# Patient Record
Sex: Female | Born: 1950 | Race: Black or African American | Hispanic: No | Marital: Married | Smoking: Never smoker
Health system: Southern US, Community
[De-identification: ages and names within clinical notes are randomized; demographics above are authoritative.]

## PROBLEM LIST (undated history)

## (undated) DIAGNOSIS — E119 Type 2 diabetes mellitus without complications: Secondary | ICD-10-CM

## (undated) DIAGNOSIS — E785 Hyperlipidemia, unspecified: Secondary | ICD-10-CM

## (undated) DIAGNOSIS — I10 Essential (primary) hypertension: Secondary | ICD-10-CM

## (undated) HISTORY — DX: Essential (primary) hypertension: I10

## (undated) HISTORY — DX: Type 2 diabetes mellitus without complications: E11.9

## (undated) HISTORY — DX: Hyperlipidemia, unspecified: E78.5

---

## 2008-05-28 ENCOUNTER — Emergency Department (HOSPITAL_COMMUNITY): Admission: EM | Admit: 2008-05-28 | Discharge: 2008-05-28 | Payer: Self-pay | Admitting: Emergency Medicine
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
## 2018-05-24 LAB — HEMOGLOBIN A1C: Hemoglobin A1C: 10.6

## 2018-07-11 ENCOUNTER — Encounter: Payer: Self-pay | Admitting: "Endocrinology

## 2018-07-11 ENCOUNTER — Ambulatory Visit (INDEPENDENT_AMBULATORY_CARE_PROVIDER_SITE_OTHER): Payer: Medicare Other | Admitting: "Endocrinology

## 2018-07-11 VITALS — BP 132/85 | HR 74 | Ht 66.0 in | Wt 269.0 lb

## 2018-07-11 DIAGNOSIS — E782 Mixed hyperlipidemia: Secondary | ICD-10-CM

## 2018-07-11 DIAGNOSIS — I10 Essential (primary) hypertension: Secondary | ICD-10-CM

## 2018-07-11 DIAGNOSIS — E039 Hypothyroidism, unspecified: Secondary | ICD-10-CM | POA: Diagnosis not present

## 2018-07-11 DIAGNOSIS — E1165 Type 2 diabetes mellitus with hyperglycemia: Secondary | ICD-10-CM

## 2018-07-11 MED ORDER — ACCU-CHEK GUIDE W/DEVICE KIT: 1.0000 | PACK | 0 refills | Status: AC

## 2018-07-11 MED ORDER — GLUCOSE BLOOD VI STRP: ORAL_STRIP | 2 refills | Status: DC

## 2018-07-11 NOTE — Patient Instructions (Signed)

## 2018-07-11 NOTE — Progress Notes (Signed)
Endocrinology Consult Note       07/11/2018, 5:41 PM   Subjective:    Patient ID: Adrian Prince, female    DOB: December 24, 1950.  [place] Barretta is being seen in consultation for management of currently uncontrolled symptomatic diabetes requested by  [name], PA-C.   Past Medical History:  Diagnosis Date  . Diabetes mellitus, type II ([place])   . Hyperlipidemia   . Hypertension    History reviewed. No pertinent surgical history. Social History   Socioeconomic History  . Marital status: Married    Spouse name: Not on file  . Number of children: Not on file  . Years of education: Not on file  . Highest education level: Not on file  Occupational History  . Not on file  Social Needs  . Financial resource strain: Not on file  . Food insecurity:    Worry: Not on file    Inability: Not on file  . Transportation needs:    Medical: Not on file    Non-medical: Not on file  Tobacco Use  . Smoking status: Never Smoker  . Smokeless tobacco: Never Used  Substance and Sexual Activity  . Alcohol use: Not Currently  . Drug use: Never  . Sexual activity: Not on file  Lifestyle  . Physical activity:    Days per week: Not on file    Minutes per session: Not on file  . Stress: Not on file  Relationships  . Social connections:    Talks on phone: Not on file    Gets together: Not on file    Attends religious service: Not on file    Active member of club or organization: Not on file    Attends meetings of clubs or organizations: Not on file    Relationship status: Not on file  Other Topics Concern  . Not on file  Social History Narrative  . Not on file   Outpatient Encounter Medications as of 07/11/2018  Medication Sig  . metFORMIN (GLUCOPHAGE-XR) 500 MG 24 hr tablet Take 500 mg by mouth 2 (two) times daily with a meal.  . metoprolol succinate (TOPROL-XL) 25 MG 24 hr tablet daily.  [name]  omeprazole (PRILOSEC) 40 MG capsule daily.  . Pitavastatin Calcium (LIVALO) 2 MG TABS daily.  . Blood Glucose Monitoring Suppl (ACCU-CHEK GUIDE) w/Device KIT 1 Piece by Does not apply route as directed.  . cetirizine (ZYRTEC) 10 MG tablet daily.  . clotrimazole-betamethasone (LOTRISONE) cream daily.  . Ferrous Sulfate (IRON) 325 (65 Fe) MG TABS daily.  . furosemide (LASIX) 20 MG tablet daily.  [name] glucose blood (ACCU-CHEK GUIDE) test strip Use as instructed  . HYDROcodone-acetaminophen (NORCO/VICODIN) 5-325 MG tablet 2 (two) times daily as needed.  [name] levothyroxine (SYNTHROID, LEVOTHROID) 50 MCG tablet daily.  . valsartan-hydrochlorothiazide (DIOVAN-HCT) 320-25 MG tablet TK 1 T PO QD  . [DISCONTINUED] glipiZIDE (GLUCOTROL XL) 10 MG 24 hr tablet daily.   No facility-administered encounter medications on file as of 07/11/2018.     ALLERGIES: No Known Allergies  VACCINATION STATUS:  There is no immunization history on file for this patient.  Diabetes  She presents  for her initial diabetic visit. She has type 2 diabetes mellitus. Onset time: Was diagnosed at approximate age of 81 years. Her disease course has been worsening. There are no hypoglycemic associated symptoms. Pertinent negatives for hypoglycemia include no confusion, headaches, pallor or seizures. Associated symptoms include fatigue, polydipsia and polyuria. Pertinent negatives for diabetes include no chest pain and no polyphagia. There are no hypoglycemic complications. Symptoms are worsening. Risk factors for coronary artery disease include dyslipidemia, diabetes mellitus, obesity, hypertension, sedentary lifestyle and post-menopausal. Current diabetic treatment includes oral agent (dual therapy). Her weight is fluctuating minimally. She is following a generally unhealthy diet. When asked about meal planning, she reported none. She has not had a previous visit with a dietitian. She rarely participates in exercise. (She did not bring any  meter nor logs to review.  Her most recent A1c of 10.6% from May 24, 2018.) An ACE inhibitor/angiotensin II receptor blocker is being taken. She does not see a podiatrist.Eye exam is current.  Hyperlipidemia  This is a chronic problem. The current episode started more than 1 year ago. Exacerbating diseases include diabetes, hypothyroidism and obesity. Pertinent negatives include no chest pain, myalgias or shortness of breath. Risk factors for coronary artery disease include diabetes mellitus, dyslipidemia, hypertension, a sedentary lifestyle, post-menopausal, obesity and family history.  Hypertension  This is a chronic problem. The current episode started more than 1 year ago. Pertinent negatives include no chest pain, headaches, palpitations or shortness of breath. Risk factors for coronary artery disease include dyslipidemia, diabetes mellitus, obesity, sedentary lifestyle, family history and post-menopausal state. Past treatments include angiotensin blockers and beta blockers.      Review of Systems  Constitutional: Positive for fatigue. Negative for chills, fever and unexpected weight change.  HENT: Negative for trouble swallowing and voice change.   Eyes: Negative for visual disturbance.  Respiratory: Negative for cough, shortness of breath and wheezing.   Cardiovascular: Negative for chest pain, palpitations and leg swelling.  Gastrointestinal: Negative for diarrhea, nausea and vomiting.  Endocrine: Positive for polydipsia and polyuria. Negative for cold intolerance, heat intolerance and polyphagia.  Musculoskeletal: Negative for arthralgias and myalgias.  Skin: Negative for color change, pallor, rash and wound.  Neurological: Negative for seizures and headaches.  Psychiatric/Behavioral: Negative for confusion and suicidal ideas.    Objective:    BP 132/85   Pulse 74   Ht _0  (1.676 m)   Wt 269 lb (122 kg)   BMI 43.42 kg/m   Wt Readings from Last 3 Encounters:  07/11/18 269  lb (122 kg)     Physical Exam  Constitutional: She is oriented to person, place, and time. She appears well-developed.  HENT:  Head: Normocephalic and atraumatic.  Eyes: EOM are normal.  Neck: Normal range of motion. Neck supple. No tracheal deviation present. No thyromegaly present.  Cardiovascular: Normal rate and regular rhythm.  Pulmonary/Chest: Effort normal and breath sounds normal.  Abdominal: Soft. Bowel sounds are normal. There is no tenderness. There is no guarding.  Musculoskeletal: Normal range of motion. She exhibits no edema.  Neurological: She is alert and oriented to person, place, and time. She has normal reflexes. No cranial nerve deficit. Coordination normal.  Skin: Skin is warm and dry. No rash noted. No erythema. No pallor.  Psychiatric: She has a normal mood and affect. Judgment normal.     May 24, 2018 A1c 10.6%.        Assessment & Plan:   1. Uncontrolled type 2 diabetes mellitus with hyperglycemia (  [place])  - [place] has currently uncontrolled symptomatic type 2 DM since 67 years of age,  with most recent A1c of 10.6 %. Recent labs reviewed.  -her diabetes is complicated by obesity/sedentary life and she remains at a high risk for more acute and chronic complications which include CAD, CVA, CKD, retinopathy, and neuropathy. These are all discussed in detail with her.  - I have counseled her on diet management and weight loss, by adopting a carbohydrate restricted/protein rich diet.  - Suggestion is made for her to avoid simple carbohydrates  from her diet including Cakes, Sweet Desserts, Ice Cream, Soda (diet and regular), Sweet Tea, Candies, Chips, Cookies, Store Bought Juices, Alcohol in Excess of  1-2 drinks a day, Artificial Sweeteners,  Coffee Creamer, and "Sugar-free" Products. This will help patient to have more stable blood glucose profile and potentially avoid unintended weight gain.  - I encouraged her to switch to  unprocessed or  minimally processed complex starch and increased protein intake (animal or plant source), fruits, and vegetables.  - she is advised to stick to a routine mealtimes to eat 3 meals  a day and avoid unnecessary snacks ( to snack only to correct hypoglycemia).   - she will be scheduled with Jearld Fenton, RDN, CDE for individualized diabetes education.  - I have approached her with the following individualized plan to manage diabetes and patient agrees:   -Based on her current glycemic burden with A1c of 10.6%, she was approached for basal insulin, however she hesitates to accept this offer.  -She is willing to start monitoring blood glucose 4 times a day--before meals and at bedtime. -She is advised to return in 10 days with her meter and logs.  - she is encouraged to call clinic for blood glucose levels less than 70 or above 300 mg /dl.In the meantime I advised her to continue metformin 500 mg ER p.o. twice daily.  - I will discontinue glipizide, risk outweighs benefit for this patient. - she will be considered for incretin therapy as appropriate next visit. - Patient specific target  A1c;  LDL, HDL, Triglycerides, and  Waist Circumference were discussed in detail.  2) BP/HTN:  her blood pressure is controlled to target.   she is advised to continue her current medications including losartan/HCTZ 320/25 mg p.o. daily with breakfast . 3) Lipids/HPL:   No recent lipid panel to review.   she  is advised to continue    pitavastatin 1 mg daily at bedtime.  Side effects and precautions discussed with her.  4)  Weight/Diet:  Body mass index is 43.42 kg/m.  - clearly complicating her diabetes care.  I discussed with her the fact that loss of 5 - 10% of her  current body weight will have the most impact on her diabetes management.  CDE Consult will be initiated . Exercise, and detailed carbohydrates information provided  -  detailed on discharge instructions.  5) Chronic Care/Health  Maintenance:  -she  is on ACEI/ARB and Statin medications and  is encouraged to initiate and continue to follow up with Ophthalmology, Dentist,  Podiatrist at least yearly or according to recommendations, and advised to  stay away from smoking. I have recommended yearly flu vaccine and pneumonia vaccine at least every 5 years; moderate intensity exercise for up to 150 minutes weekly; and  sleep for at least 7 hours a day.  - I advised patient to maintain close follow up with [name], PA-C for primary care needs.  -  Time spent with the patient: 45 minutes, of which >50% was spent in obtaining information about her symptoms, reviewing her previous labs, evaluations, and treatments, counseling her about her currently uncontrolled type 2 diabetes, hyperlipidemia, hypertension, and developing developing  plans for long term treatment based on the latest recommendations.  [place] participated in the discussions, expressed understanding, and voiced agreement with the above plans.  All questions were answered to her satisfaction. she is encouraged to contact clinic should she have any questions or concerns prior to her return visit.  Follow up plan: - Return in about 10 days (around 07/21/2018) for Follow up with Meter and Logs Only - no Labs.  Glade Lloyd, MD [facility] [address] [place], [place] [address] Phone: [phone]  Fax: [phone]    07/11/2018, 5:41 PM  This note was partially dictated with voice recognition software. Similar sounding words can be transcribed inadequately or may not  be corrected upon review.

## 2018-07-12 ENCOUNTER — Other Ambulatory Visit: Payer: Self-pay

## 2018-07-12 MED ORDER — BLOOD GLUCOSE MONITOR KIT: PACK | 3 refills | Status: AC

## 2018-07-13 ENCOUNTER — Encounter: Payer: Medicare Other | Attending: "Endocrinology | Admitting: Nutrition

## 2018-07-13 ENCOUNTER — Other Ambulatory Visit: Payer: Self-pay

## 2018-07-13 ENCOUNTER — Encounter: Payer: Self-pay | Admitting: Nutrition

## 2018-07-13 VITALS — Ht 68.0 in | Wt 271.0 lb

## 2018-07-13 DIAGNOSIS — E118 Type 2 diabetes mellitus with unspecified complications: Secondary | ICD-10-CM

## 2018-07-13 DIAGNOSIS — E1165 Type 2 diabetes mellitus with hyperglycemia: Secondary | ICD-10-CM | POA: Insufficient documentation

## 2018-07-13 DIAGNOSIS — Z713 Dietary counseling and surveillance: Secondary | ICD-10-CM | POA: Diagnosis not present

## 2018-07-13 DIAGNOSIS — E669 Obesity, unspecified: Secondary | ICD-10-CM

## 2018-07-13 DIAGNOSIS — IMO0002 Reserved for concepts with insufficient information to code with codable children: Secondary | ICD-10-CM

## 2018-07-13 NOTE — Progress Notes (Signed)
  Medical Nutrition Therapy:  Appt start time: 1100 end time:  1200.   Assessment:  Primary concerns today: Diabetes Type 2. Live with her husband and cares for 2 grandkids. Dm for 10+ yrs. Eats 2-3 meals per day.  Feels burned out with her DM. Keeps gaining weight but doesn't eat much. Eats out often and skips lunch usually. Walking some for exercise. Sees Dr. Fransico Him, Endocrinology. Has to get strips for her meter. Suppose to test 4 times per day. Only drinking water. Currently on 500 mg of Metformin BID.Was recently taken off of Glipizide.  Testing blood sugars 4 times per day per Dr. Fransico Him til next visit. Willing to make changes with diet and lifestyle to improve her DM and lose weight.  Lab Results  Component Value Date   HGBA1C 10.6 05/24/2018   Vitals with BMI 07/13/2018  Height 5\' 8"   Weight 271 lbs  BMI 41.22  Systolic   Diastolic   Pulse      Preferred Learning Style:    No preference indicated   Learning Readiness:    Ready  Change in progress   MEDICATIONS:    DIETARY INTAKE:   24-hr recall:  B ( AM): Water and apple slices Snk ( AM): water  L ( PM): Rotisserie chicken, water Snk ( PM):  D ( PM): Broccoli, cauliflower and ranch dressing.  Snk ( PM):  Beverages: water  Usual physical activity: ADL  Estimated energy needs: 1500  calories 170 g carbohydrates 112 g protein 42 g fat  Progress Towards Goal(s):  In progress.   Nutritional Diagnosis:  NB-1.1 Food and nutrition-related knowledge deficit As related to Diabetes.  As evidenced by A1C 10.6%.    Intervention:  Nutrition and Diabetes education provided on My Plate, CHO counting, meal planning, portion sizes, timing of meals, avoiding snacks between meals unless having a low blood sugar, target ranges for A1C and blood sugars, signs/symptoms and treatment of hyper/hypoglycemia, monitoring blood sugars, taking medications as prescribed, benefits of exercising 30 minutes per day and prevention of  complications of DM. Marland KitchenGoals 1. Follow MY Plate 2. Eat three meals per day 3. Cut out snacks 4, Drink only water 5. Test BS 4 times per day per Dr. Fransico Him  Teaching Method Utilized:  Visual Auditory Hands on  Handouts given during visit include:  The Plate Method  Meal Plan Card   Barriers to learning/adherence to lifestyle change: none   Demonstrated degree of understanding via:  Teach Back   Monitoring/Evaluation:  Dietary intake, exercise, , and body weight in 1 month(s).

## 2018-07-13 NOTE — Patient Instructions (Signed)
Goals 1. Follow MY Plate 2. Eat three meals per day 3. Cut out snacks 4, Drink only water 5. Test BS 4 times per day per Dr. Fransico Him

## 2018-07-25 ENCOUNTER — Encounter: Payer: Self-pay | Admitting: "Endocrinology

## 2018-07-25 ENCOUNTER — Ambulatory Visit (INDEPENDENT_AMBULATORY_CARE_PROVIDER_SITE_OTHER): Payer: Medicare Other | Admitting: "Endocrinology

## 2018-07-25 VITALS — BP 159/92 | HR 67 | Ht 66.0 in | Wt 266.0 lb

## 2018-07-25 DIAGNOSIS — E1165 Type 2 diabetes mellitus with hyperglycemia: Secondary | ICD-10-CM | POA: Diagnosis not present

## 2018-07-25 DIAGNOSIS — E039 Hypothyroidism, unspecified: Secondary | ICD-10-CM | POA: Diagnosis not present

## 2018-07-25 DIAGNOSIS — I10 Essential (primary) hypertension: Secondary | ICD-10-CM

## 2018-07-25 DIAGNOSIS — E782 Mixed hyperlipidemia: Secondary | ICD-10-CM | POA: Diagnosis not present

## 2018-07-25 LAB — GLUCOSE, POCT (MANUAL RESULT ENTRY): POC Glucose: 271 mg/dl — AB (ref 70–99)

## 2018-07-25 MED ORDER — INSULIN GLARGINE 100 UNIT/ML SOLOSTAR PEN: 30.0000 [IU] | PEN_INJECTOR | Freq: Every day | SUBCUTANEOUS | 2 refills | Status: DC

## 2018-07-25 MED ORDER — INSULIN PEN NEEDLE 31G X 8 MM MISC
1.0000 | 3 refills | Status: DC
Start: 2018-07-25 — End: 2018-07-26

## 2018-07-25 NOTE — Patient Instructions (Signed)

## 2018-07-25 NOTE — Progress Notes (Signed)
Endocrinology follow up Note       07/25/2018, 9:31 AM   Subjective:    Patient ID: Natasha Hinton, female    DOB: [birth year].  Natasha Hinton is being seen in follow up for management of currently uncontrolled symptomatic type 2 diabetes, hypothyroidism, hyperlipidemia, hypertension. PCP:   [name], PA-C.   Past Medical History:  Diagnosis Date  . Diabetes mellitus, type II ([place])   . Hyperlipidemia   . Hypertension    History reviewed. No pertinent surgical history. Social History   Socioeconomic History  . Marital status: Married    Spouse name: Not on file  . Number of children: Not on file  . Years of education: Not on file  . Highest education level: Not on file  Occupational History  . Not on file  Social Needs  . Financial resource strain: Not on file  . Food insecurity:    Worry: Not on file    Inability: Not on file  . Transportation needs:    Medical: Not on file    Non-medical: Not on file  Tobacco Use  . Smoking status: Never Smoker  . Smokeless tobacco: Never Used  Substance and Sexual Activity  . Alcohol use: Not Currently  . Drug use: Never  . Sexual activity: Not on file  Lifestyle  . Physical activity:    Days per week: Not on file    Minutes per session: Not on file  . Stress: Not on file  Relationships  . Social connections:    Talks on phone: Not on file    Gets together: Not on file    Attends religious service: Not on file    Active member of club or organization: Not on file    Attends meetings of clubs or organizations: Not on file    Relationship status: Not on file  Other Topics Concern  . Not on file  Social History Narrative  . Not on file   Outpatient Encounter Medications as of 07/25/2018  Medication Sig  . blood glucose meter kit and supplies KIT Dispense based on patient and insurance preference. Use up to four times daily as  directed. (FOR ICD-10 E11.65)  . Blood Glucose Monitoring Suppl (ACCU-CHEK GUIDE) w/Device KIT 1 Piece by Does not apply route as directed.  . cetirizine (ZYRTEC) 10 MG tablet daily.  . clotrimazole-betamethasone (LOTRISONE) cream daily.  . Ferrous Sulfate (IRON) 325 (65 Fe) MG TABS daily.  . furosemide (LASIX) 20 MG tablet daily.  [name] glucose blood (ACCU-CHEK GUIDE) test strip Use as instructed  . HYDROcodone-acetaminophen (NORCO/VICODIN) 5-325 MG tablet 2 (two) times daily as needed.  . Insulin Glargine (LANTUS SOLOSTAR) 100 UNIT/ML Solostar Pen Inject 30 Units into the skin daily.  . Insulin Pen Needle (B-D ULTRAFINE III SHORT PEN) 31G X 8 MM MISC 1 each by Does not apply route as directed.  [name] levothyroxine (SYNTHROID, LEVOTHROID) 50 MCG tablet daily.  . metFORMIN (GLUCOPHAGE-XR) 500 MG 24 hr tablet Take 500 mg by mouth 2 (two) times daily with a meal.  . metoprolol succinate (TOPROL-XL) 25 MG 24 hr tablet daily.  [name] omeprazole (PRILOSEC)  40 MG capsule daily.  . Pitavastatin Calcium (LIVALO) 2 MG TABS daily.  . valsartan-hydrochlorothiazide (DIOVAN-HCT) 320-25 MG tablet TK 1 T PO QD   No facility-administered encounter medications on file as of 07/25/2018.     ALLERGIES: No Known Allergies  VACCINATION STATUS:  There is no immunization history on file for this patient.  Diabetes  She presents for her follow-up diabetic visit. She has type 2 diabetes mellitus. Onset time: Was diagnosed at approximate age of 58 years. Her disease course has been worsening. There are no hypoglycemic associated symptoms. Pertinent negatives for hypoglycemia include no confusion, headaches, pallor or seizures. Associated symptoms include fatigue, polydipsia and polyuria. Pertinent negatives for diabetes include no chest pain and no polyphagia. There are no hypoglycemic complications. Symptoms are worsening. Risk factors for coronary artery disease include dyslipidemia, diabetes mellitus, obesity, hypertension,  sedentary lifestyle and post-menopausal. Current diabetic treatment includes oral agent (dual therapy). Her weight is fluctuating minimally. She is following a generally unhealthy diet. When asked about meal planning, she reported none. She has not had a previous visit with a dietitian. She rarely participates in exercise. Her breakfast blood glucose range is generally >200 mg/dl. Her lunch blood glucose range is generally >200 mg/dl. Her dinner blood glucose range is generally >200 mg/dl. Her bedtime blood glucose range is generally >200 mg/dl. Her overall blood glucose range is >200 mg/dl. (She presents with persistently above target glycemic profile averaging 250-300 mg/dL.  Her most recent A1c of 10.6% from May 24, 2018.) An ACE inhibitor/angiotensin II receptor blocker is being taken. She does not see a podiatrist.Eye exam is current.  Hyperlipidemia  This is a chronic problem. The current episode started more than 1 year ago. Exacerbating diseases include diabetes, hypothyroidism and obesity. Pertinent negatives include no chest pain, myalgias or shortness of breath. Risk factors for coronary artery disease include diabetes mellitus, dyslipidemia, hypertension, a sedentary lifestyle, post-menopausal, obesity and family history.  Hypertension  This is a chronic problem. The current episode started more than 1 year ago. Pertinent negatives include no chest pain, headaches, palpitations or shortness of breath. Risk factors for coronary artery disease include dyslipidemia, diabetes mellitus, obesity, sedentary lifestyle, family history and post-menopausal state. Past treatments include angiotensin blockers and beta blockers.    Review of Systems  Constitutional: Positive for fatigue. Negative for chills, fever and unexpected weight change.  HENT: Negative for trouble swallowing and voice change.   Eyes: Negative for visual disturbance.  Respiratory: Negative for cough, shortness of breath and  wheezing.   Cardiovascular: Negative for chest pain, palpitations and leg swelling.  Gastrointestinal: Negative for diarrhea, nausea and vomiting.  Endocrine: Positive for polydipsia and polyuria. Negative for cold intolerance, heat intolerance and polyphagia.  Musculoskeletal: Negative for arthralgias and myalgias.  Skin: Negative for color change, pallor, rash and wound.  Neurological: Negative for seizures and headaches.  Psychiatric/Behavioral: Negative for confusion and suicidal ideas.    Objective:    BP (!) 159/92   Pulse 67   Ht '5\' 6"'  (1.676 m)   Wt 266 lb (120.7 kg)   BMI 42.93 kg/m   Wt Readings from Last 3 Encounters:  07/25/18 266 lb (120.7 kg)  07/13/18 271 lb (122.9 kg)  07/11/18 269 lb (122 kg)     Physical Exam  Constitutional: She is oriented to person, place, and time. She appears well-developed.  HENT:  Head: Normocephalic and atraumatic.  Eyes: EOM are normal.  Neck: Normal range of motion. Neck supple. No tracheal deviation present.  No thyromegaly present.  Cardiovascular: Normal rate and regular rhythm.  Pulmonary/Chest: Effort normal and breath sounds normal.  Abdominal: Soft. Bowel sounds are normal. There is no tenderness. There is no guarding.  Musculoskeletal: Normal range of motion. She exhibits no edema.  Neurological: She is alert and oriented to person, place, and time. She has normal reflexes. No cranial nerve deficit. Coordination normal.  Skin: Skin is warm and dry. No rash noted. No erythema. No pallor.  Psychiatric: She has a normal mood and affect. Judgment normal.      Recent Results (from the past 2160 hour(s))  Hemoglobin A1c     Status: None   Collection Time: 05/24/18 12:00 AM  Result Value Ref Range   Hemoglobin A1C 10.6      Assessment & Plan:   1. Uncontrolled type 2 diabetes mellitus with hyperglycemia ([place])  - Natasha Hinton has currently uncontrolled symptomatic type 2 DM since 67 years of age. -She presents with  persistent, severe hyperglycemia averaging 250-300 mg/dL over the last 2 weeks.  Her most recent A1c was 10.6%.  -her diabetes is complicated by obesity/sedentary life and she remains at a high risk for more acute and chronic complications which include CAD, CVA, CKD, retinopathy, and neuropathy. These are all discussed in detail with her.  - I have counseled her on diet management and weight loss, by adopting a carbohydrate restricted/protein rich diet.  -She still admits to dietary indiscretions, including consumption of sweets and sweetened beverages. -  Suggestion is made for her to avoid simple carbohydrates  from her diet including Cakes, Sweet Desserts / Pastries, Ice Cream, Soda (diet and regular), Sweet Tea, Candies, Chips, Cookies, Store Bought Juices, Alcohol in Excess of  1-2 drinks a day, Artificial Sweeteners, and "Sugar-free" Products. This will help patient to have stable blood glucose profile and potentially avoid unintended weight gain.   - I encouraged her to switch to  unprocessed or minimally processed complex starch and increased protein intake (animal or plant source), fruits, and vegetables.  - she is advised to stick to a routine mealtimes to eat 3 meals  a day and avoid unnecessary snacks ( to snack only to correct hypoglycemia).   - I have approached her with the following individualized plan to manage diabetes and patient agrees:   -Based on her current glycemic burden with A1c of 10.6%, she is approached for basal insulin for the second time and this time she is accepting.   -I discussed and initiated Lantus 30 units nightly, associated with monitoring of blood glucose at least 2 times a day-daily before breakfast and at bedtime. With a sample of Lantus, insulin injection technique is demonstrated for her in the exam room. -She may require prandial insulin based on her commitment and presentation during her next visit in 5 weeks. -She is advised to continue metformin  500 mg p.o. twice daily.  - she is encouraged to call clinic for blood glucose levels less than 70 or above 300 mg /dl.  - she will be considered for incretin therapy as appropriate next visit. - Patient specific target  A1c;  LDL, HDL, Triglycerides, and  Waist Circumference were discussed in detail.  2) BP/HTN:  her blood pressure is controlled to target.   she is advised to continue her current medications including losartan/HCTZ 320/25 mg p.o. daily with breakfast . 3) Lipids/HPL:   No recent lipid panel to review.   she  is advised to continue    pitavastatin 1 mg  daily at bedtime.  She will have fasting lipid panel before her next visit.  Side effects and precautions discussed with her.  4)  Weight/Diet:  Body mass index is 42.93 kg/m.  - clearly complicating her diabetes care.  I discussed with her the fact that loss of 5 - 10% of her  current body weight will have the most impact on her diabetes management.  CDE Consult will be initiated . Exercise, and detailed carbohydrates information provided  -  detailed on discharge instructions.  5) hypothyroidism: -The circumstance of her diagnosis are not available to review. -She is currently on levothyroxine 50 mcg p.o. every morning.  - We discussed about correct intake of levothyroxine, at fasting, with water, separated by at least 30 minutes from breakfast, and separated by more than 4 hours from calcium, iron, multivitamins, acid reflux medications (PPIs). -Patient is made aware of the fact that thyroid hormone replacement is needed for life, dose to be adjusted by periodic monitoring of thyroid function tests.  6) Chronic Care/Health Maintenance:  -she  is on ACEI/ARB and Statin medications and  is encouraged to initiate and continue to follow up with Ophthalmology, Dentist,  Podiatrist at least yearly or according to recommendations, and advised to  stay away from smoking. I have recommended yearly flu vaccine and pneumonia vaccine at  least every 5 years; moderate intensity exercise for up to 150 minutes weekly; and  sleep for at least 7 hours a day.  - I advised patient to maintain close follow up with [name], PA-C for primary care needs.  - Time spent with the patient: 25 min, of which >50% was spent in reviewing her blood glucose logs , discussing her hypo- and hyper-glycemic episodes, reviewing her current and  previous labs and insulin doses and developing a plan to avoid hypo- and hyper-glycemia. Please refer to Patient Instructions for Blood Glucose Monitoring and Insulin/Medications Dosing Guide"  in media tab for additional information. Natasha Hinton participated in the discussions, expressed understanding, and voiced agreement with the above plans.  All questions were answered to her satisfaction. she is encouraged to contact clinic should she have any questions or concerns prior to her return visit.   Follow up plan: - Return in about 5 weeks (around 08/29/2018) for Follow up with Pre-visit Labs, Meter, and Logs.  Glade Lloyd, MD [facility] [address] [place], [place] [address] Phone: [phone]  Fax: [phone]    07/25/2018, 9:31 AM  This note was partially dictated with voice recognition software. Similar sounding words can be transcribed inadequately or may not  be corrected upon review.

## 2018-07-26 ENCOUNTER — Other Ambulatory Visit: Payer: Self-pay

## 2018-07-26 MED ORDER — INSULIN PEN NEEDLE 31G X 8 MM MISC: 1.0000 | Freq: Every day | 3 refills | Status: DC

## 2018-08-15 ENCOUNTER — Telehealth: Payer: Self-pay

## 2018-08-15 ENCOUNTER — Other Ambulatory Visit: Payer: Self-pay

## 2018-08-15 MED ORDER — GLUCOSE BLOOD VI STRP: ORAL_STRIP | 5 refills | Status: DC

## 2018-08-15 NOTE — Telephone Encounter (Signed)
Pt states she has had high BG readings. She states she started a medrol dose pak 08/13/18.    Date Before breakfast Before lunch Before supper Bedtime  11/9    390  11/10 246   414  11/11 361             Pt taking: Lantus 30 units, Metformin XR 500mg  bid.   She will also call her PCP for any recommendations and make [place] aware of any changes.

## 2018-08-22 ENCOUNTER — Encounter: Payer: Self-pay | Admitting: "Endocrinology

## 2018-08-22 LAB — LIPID PANEL
Cholesterol: 278 — AB (ref 0–200)
HDL: 87 — AB (ref 35–70)
LDL CALC: 171
Triglycerides: 98 (ref 40–160)

## 2018-08-22 LAB — BASIC METABOLIC PANEL
BUN: 25 — AB (ref 4–21)
Creatinine: 1 (ref 0.5–1.1)

## 2018-08-22 LAB — MICROALBUMIN, URINE: MICROALB UR: 0.2

## 2018-08-22 LAB — TSH: TSH: 1.37 (ref 0.41–5.90)

## 2018-08-22 LAB — HEMOGLOBIN A1C: HEMOGLOBIN A1C: 10.5

## 2018-08-22 NOTE — Telephone Encounter (Signed)
Increase Lantus to 50 units nightly, start monitoring blood glucose 4 times a day-before meals and at bedtime, continue metformin XR 500 mg p.o. twice daily.  Advise her to keep her appointments.

## 2018-08-23 NOTE — Telephone Encounter (Signed)
Called pt. No answer. VM full 

## 2018-08-24 NOTE — Telephone Encounter (Signed)
Called pt. No answer. VM full. Awaiting return call

## 2018-08-29 ENCOUNTER — Ambulatory Visit (INDEPENDENT_AMBULATORY_CARE_PROVIDER_SITE_OTHER): Payer: Medicare Other | Admitting: "Endocrinology

## 2018-08-29 ENCOUNTER — Encounter: Payer: Self-pay | Admitting: "Endocrinology

## 2018-08-29 VITALS — BP 135/73 | HR 102 | Ht 66.0 in | Wt 258.0 lb

## 2018-08-29 DIAGNOSIS — E039 Hypothyroidism, unspecified: Secondary | ICD-10-CM

## 2018-08-29 DIAGNOSIS — E782 Mixed hyperlipidemia: Secondary | ICD-10-CM

## 2018-08-29 DIAGNOSIS — I10 Essential (primary) hypertension: Secondary | ICD-10-CM

## 2018-08-29 DIAGNOSIS — E1165 Type 2 diabetes mellitus with hyperglycemia: Secondary | ICD-10-CM | POA: Diagnosis not present

## 2018-08-29 MED ORDER — INSULIN GLARGINE 100 UNIT/ML SOLOSTAR PEN: 50.0000 [IU] | PEN_INJECTOR | Freq: Every day | SUBCUTANEOUS | 2 refills | Status: DC

## 2018-08-29 MED ORDER — LEVOTHYROXINE SODIUM 75 MCG PO TABS: 75.0000 ug | ORAL_TABLET | Freq: Every day | ORAL | 3 refills | Status: DC

## 2018-08-29 NOTE — Progress Notes (Signed)
Endocrinology follow up Note       08/29/2018, 12:56 PM   Subjective:    Patient ID: Natasha Hinton, female    DOB: 11/22/1950.  Natasha Hinton is being seen in follow up for management of currently uncontrolled symptomatic type 2 diabetes, hypothyroidism, hyperlipidemia, hypertension. PCP:   [name], PA-C.   Past Medical History:  Diagnosis Date  . Diabetes mellitus, type II ([place])   . Hyperlipidemia   . Hypertension    History reviewed. No pertinent surgical history. Social History   Socioeconomic History  . Marital status: Married    Spouse name: Not on file  . Number of children: Not on file  . Years of education: Not on file  . Highest education level: Not on file  Occupational History  . Not on file  Social Needs  . Financial resource strain: Not on file  . Food insecurity:    Worry: Not on file    Inability: Not on file  . Transportation needs:    Medical: Not on file    Non-medical: Not on file  Tobacco Use  . Smoking status: Never Smoker  . Smokeless tobacco: Never Used  Substance and Sexual Activity  . Alcohol use: Not Currently  . Drug use: Never  . Sexual activity: Not on file  Lifestyle  . Physical activity:    Days per week: Not on file    Minutes per session: Not on file  . Stress: Not on file  Relationships  . Social connections:    Talks on phone: Not on file    Gets together: Not on file    Attends religious service: Not on file    Active member of club or organization: Not on file    Attends meetings of clubs or organizations: Not on file    Relationship status: Not on file  Other Topics Concern  . Not on file  Social History Narrative  . Not on file   Outpatient Encounter Medications as of 08/29/2018  Medication Sig  . blood glucose meter kit and supplies KIT Dispense based on patient and insurance preference. Use up to four times daily as  directed. (FOR ICD-10 E11.65)  . Blood Glucose Monitoring Suppl (ACCU-CHEK GUIDE) w/Device KIT 1 Piece by Does not apply route as directed.  . cetirizine (ZYRTEC) 10 MG tablet daily.  . clotrimazole-betamethasone (LOTRISONE) cream daily.  . Ferrous Sulfate (IRON) 325 (65 Fe) MG TABS daily.  . furosemide (LASIX) 20 MG tablet daily.  [name] glucose blood (ACCU-CHEK GUIDE) test strip Use as instructed bid. E11.65  . HYDROcodone-acetaminophen (NORCO/VICODIN) 5-325 MG tablet 2 (two) times daily as needed.  . Insulin Glargine (LANTUS SOLOSTAR) 100 UNIT/ML Solostar Pen Inject 50 Units into the skin at bedtime.  . Insulin Pen Needle (B-D ULTRAFINE III SHORT PEN) 31G X 8 MM MISC 1 each by Does not apply route at bedtime.  [name] levothyroxine (SYNTHROID, LEVOTHROID) 75 MCG tablet Take 1 tablet (75 mcg total) by mouth daily before breakfast.  . metFORMIN (GLUCOPHAGE-XR) 500 MG 24 hr tablet Take 500 mg by mouth 2 (two) times daily with a meal.  .  metoprolol succinate (TOPROL-XL) 25 MG 24 hr tablet daily.  [name] omeprazole (PRILOSEC) 40 MG capsule daily.  . Pitavastatin Calcium (LIVALO) 2 MG TABS daily.  . valsartan-hydrochlorothiazide (DIOVAN-HCT) 320-25 MG tablet TK 1 T PO QD  . [DISCONTINUED] Insulin Glargine (LANTUS SOLOSTAR) 100 UNIT/ML Solostar Pen Inject 30 Units into the skin daily.  . [DISCONTINUED] levothyroxine (SYNTHROID, LEVOTHROID) 50 MCG tablet daily.   No facility-administered encounter medications on file as of 08/29/2018.     ALLERGIES: No Known Allergies  VACCINATION STATUS:  There is no immunization history on file for this patient.  Diabetes  She presents for her follow-up diabetic visit. She has type 2 diabetes mellitus. Onset time: Was diagnosed at approximate age of 51 years. Her disease course has been worsening. There are no hypoglycemic associated symptoms. Pertinent negatives for hypoglycemia include no confusion, headaches, pallor or seizures. Associated symptoms include fatigue,  polydipsia and polyuria. Pertinent negatives for diabetes include no chest pain and no polyphagia. There are no hypoglycemic complications. Symptoms are worsening. Risk factors for coronary artery disease include dyslipidemia, diabetes mellitus, obesity, hypertension, sedentary lifestyle and post-menopausal. Current diabetic treatment includes oral agent (dual therapy). Her weight is fluctuating minimally. She is following a generally unhealthy diet. When asked about meal planning, she reported none. She has not had a previous visit with a dietitian. She rarely participates in exercise. Her breakfast blood glucose range is generally 180-200 mg/dl. Her bedtime blood glucose range is generally >200 mg/dl. Her overall blood glucose range is >200 mg/dl. (Patient reports interim exposure to steroids related to upper respirator tract infection which caused her to have above target glycemic profile and her Lantus was increased to 40 units nightly by her PCP.) An ACE inhibitor/angiotensin II receptor blocker is being taken. She does not see a podiatrist.Eye exam is current.  Hyperlipidemia  This is a chronic problem. The current episode started more than 1 year ago. Exacerbating diseases include diabetes, hypothyroidism and obesity. Pertinent negatives include no chest pain, myalgias or shortness of breath. Risk factors for coronary artery disease include diabetes mellitus, dyslipidemia, hypertension, a sedentary lifestyle, post-menopausal, obesity and family history.  Hypertension  This is a chronic problem. The current episode started more than 1 year ago. Pertinent negatives include no chest pain, headaches, palpitations or shortness of breath. Risk factors for coronary artery disease include dyslipidemia, diabetes mellitus, obesity, sedentary lifestyle, family history and post-menopausal state. Past treatments include angiotensin blockers and beta blockers.    Review of Systems  Constitutional: Positive for  fatigue. Negative for chills, fever and unexpected weight change.  HENT: Negative for trouble swallowing and voice change.   Eyes: Negative for visual disturbance.  Respiratory: Negative for cough, shortness of breath and wheezing.   Cardiovascular: Negative for chest pain, palpitations and leg swelling.  Gastrointestinal: Negative for diarrhea, nausea and vomiting.  Endocrine: Positive for polydipsia and polyuria. Negative for cold intolerance, heat intolerance and polyphagia.  Musculoskeletal: Negative for arthralgias and myalgias.  Skin: Negative for color change, pallor, rash and wound.  Neurological: Negative for seizures and headaches.  Psychiatric/Behavioral: Negative for confusion and suicidal ideas.    Objective:    BP 135/73   Pulse (!) 102   Ht '5\' 6"'  (1.676 m)   Wt 258 lb (117 kg)   BMI 41.64 kg/m   Wt Readings from Last 3 Encounters:  08/29/18 258 lb (117 kg)  07/25/18 266 lb (120.7 kg)  07/13/18 271 lb (122.9 kg)     Physical Exam  Constitutional: She  is oriented to person, place, and time. She appears well-developed.  HENT:  Head: Normocephalic and atraumatic.  Eyes: EOM are normal.  Neck: Normal range of motion. Neck supple. No tracheal deviation present. No thyromegaly present.  Cardiovascular: Normal rate and regular rhythm.  Pulmonary/Chest: Effort normal and breath sounds normal.  Abdominal: Soft. Bowel sounds are normal. There is no tenderness. There is no guarding.  Musculoskeletal: Normal range of motion. She exhibits no edema.  Neurological: She is alert and oriented to person, place, and time. She has normal reflexes. No cranial nerve deficit. Coordination normal.  Skin: Skin is warm and dry. No rash noted. No erythema. No pallor.  Psychiatric: She has a normal mood and affect. Judgment normal.      Recent Results (from the past 2160 hour(s))  POCT glucose (manual entry)     Status: Abnormal   Collection Time: 07/25/18 10:45 AM  Result Value Ref  Range   POC Glucose 271 (A) 70 - 99 mg/dl    Comment: 1 hr PC  Microalbumin, urine     Status: None   Collection Time: 08/22/18 12:00 AM  Result Value Ref Range   Microalb, Ur 0.2   Basic metabolic panel     Status: Abnormal   Collection Time: 08/22/18 12:00 AM  Result Value Ref Range   BUN 25 (A) 4 - 21   Creatinine 1.0 0.5 - 1.1  Lipid panel     Status: Abnormal   Collection Time: 08/22/18 12:00 AM  Result Value Ref Range   Triglycerides 98 40 - 160   Cholesterol 278 (A) 0 - 200   HDL 87 (A) 35 - 70   LDL Cholesterol 171   Hemoglobin A1c     Status: None   Collection Time: 08/22/18 12:00 AM  Result Value Ref Range   Hemoglobin A1C 10.5   TSH     Status: None   Collection Time: 08/22/18 12:00 AM  Result Value Ref Range   TSH 1.37 0.41 - 5.90    Comment: free t4 0.84     Assessment & Plan:   1. Uncontrolled type 2 diabetes mellitus with hyperglycemia ([place])  - Natasha Hinton has currently uncontrolled symptomatic type 2 DM since 67 years of age. -She presents with slightly improved fasting blood glucose profile in the interim anterior she was given steroids for upper respiratory infection.  Her previsit labs show A1c of 10.5% unchanged from her last A1c of 10.6%.   -her diabetes is complicated by obesity/sedentary life and she remains at a high risk for more acute and chronic complications which include CAD, CVA, CKD, retinopathy, and neuropathy. These are all discussed in detail with her.  - I have counseled her on diet management and weight loss, by adopting a carbohydrate restricted/protein rich diet.  -She still admits to dietary indiscretions, including consumption of sweets and sweetened beverages.  -  Suggestion is made for her to avoid simple carbohydrates  from her diet including Cakes, Sweet Desserts / Pastries, Ice Cream, Soda (diet and regular), Sweet Tea, Candies, Chips, Cookies, Store Bought Juices, Alcohol in Excess of  1-2 drinks a day, Artificial  Sweeteners, and "Sugar-free" Products. This will help patient to have stable blood glucose profile and potentially avoid unintended weight gain.  - I encouraged her to switch to  unprocessed or minimally processed complex starch and increased protein intake (animal or plant source), fruits, and vegetables.  - she is advised to stick to a routine mealtimes to eat 3  meals  a day and avoid unnecessary snacks ( to snack only to correct hypoglycemia).   - I have approached her with the following individualized plan to manage diabetes and patient agrees:   -Based on her current glycemic burden with A1c of 10.5%, she will continue to require higher dose of insulin in order for her to achieve control of diabetes to target.   -She is more comfortable taking insulin this time, advised her to increase her Lantus to 50 units nightly associated with monitoring of blood glucose at least 2 times a day-daily before breakfast and at bedtime.  -She may require prandial insulin based on her commitment and presentation during her next visit.  -She is advised to continue metformin 500 mg p.o. twice daily.  - she is encouraged to call clinic for blood glucose levels less than 70 or above 300 mg /dl.  - she will be considered for incretin therapy as appropriate next visit. - Patient specific target  A1c;  LDL, HDL, Triglycerides, and  Waist Circumference were discussed in detail.  2) BP/HTN:  her blood pressure is controlled to target.   she is advised to continue her current medications including losartan/HCTZ 320/25 mg p.o. daily with breakfast . 3) Lipids/HPL: Her recent lipid panel showed LDL at 171.  She admits that she has not been consistent taking her Livalo.  She is advised to resume and continue Livalo 2 mg p.o. nightly.    4)  Weight/Diet:  Body mass index is 41.64 kg/m.  - clearly complicating her diabetes care.  I discussed with her the fact that loss of 5 - 10% of her  current body weight will have  the most impact on her diabetes management.  CDE Consult will be initiated . Exercise, and detailed carbohydrates information provided  -  detailed on discharge instructions.  5) hypothyroidism: -The circumstance of her diagnosis are not available to review. -She would benefit from a higher dose of levothyroxine.  I discussed and increase her levothyroxine to 75 mcg p.o. every morning.     - We discussed about correct intake of levothyroxine, at fasting, with water, separated by at least 30 minutes from breakfast, and separated by more than 4 hours from calcium, iron, multivitamins, acid reflux medications (PPIs). -Patient is made aware of the fact that thyroid hormone replacement is needed for life, dose to be adjusted by periodic monitoring of thyroid function tests.   6) Chronic Care/Health Maintenance:  -she  is on ACEI/ARB and Statin medications and  is encouraged to initiate and continue to follow up with Ophthalmology, Dentist,  Podiatrist at least yearly or according to recommendations, and advised to  stay away from smoking. I have recommended yearly flu vaccine and pneumonia vaccine at least every 5 years; moderate intensity exercise for up to 150 minutes weekly; and  sleep for at least 7 hours a day.  - I advised patient to maintain close follow up with [name], PA-C for primary care needs.  - Time spent with the patient: 25 min, of which >50% was spent in reviewing her blood glucose logs , discussing her hypo- and hyper-glycemic episodes, reviewing her current and  previous labs and insulin doses and developing a plan to avoid hypo- and hyper-glycemia. Please refer to Patient Instructions for Blood Glucose Monitoring and Insulin/Medications Dosing Guide"  in media tab for additional information. Natasha Hinton participated in the discussions, expressed understanding, and voiced agreement with the above plans.  All questions were answered to  her satisfaction. she is encouraged  to contact clinic should she have any questions or concerns prior to her return visit.  Follow up plan: - Return in about 3 months (around 11/29/2018) for Follow up with Pre-visit Labs, Meter, and Logs.  Glade Lloyd, MD [facility] [address] [place],  [address] Phone: [phone]  Fax: [phone]    08/29/2018, 12:56 PM  This note was partially dictated with voice recognition software. Similar sounding words can be transcribed inadequately or may not  be corrected upon review.

## 2018-08-29 NOTE — Patient Instructions (Signed)

## 2018-11-23 LAB — TSH: TSH: 3.84 (ref 0.41–5.90)

## 2018-11-23 LAB — BASIC METABOLIC PANEL
BUN: 11 (ref 4–21)
Creatinine: 0.9 (ref 0.5–1.1)

## 2018-11-23 LAB — HEMOGLOBIN A1C: Hemoglobin A1C: 8.8

## 2018-11-30 ENCOUNTER — Ambulatory Visit (INDEPENDENT_AMBULATORY_CARE_PROVIDER_SITE_OTHER): Payer: Medicare Other | Admitting: "Endocrinology

## 2018-11-30 ENCOUNTER — Encounter: Payer: Self-pay | Admitting: "Endocrinology

## 2018-11-30 VITALS — BP 155/90 | HR 72 | Ht 66.0 in | Wt 280.0 lb

## 2018-11-30 DIAGNOSIS — E782 Mixed hyperlipidemia: Secondary | ICD-10-CM | POA: Diagnosis not present

## 2018-11-30 DIAGNOSIS — E1165 Type 2 diabetes mellitus with hyperglycemia: Secondary | ICD-10-CM | POA: Diagnosis not present

## 2018-11-30 DIAGNOSIS — E039 Hypothyroidism, unspecified: Secondary | ICD-10-CM | POA: Diagnosis not present

## 2018-11-30 DIAGNOSIS — I10 Essential (primary) hypertension: Secondary | ICD-10-CM | POA: Diagnosis not present

## 2018-11-30 MED ORDER — LEVOTHYROXINE SODIUM 88 MCG PO TABS: 88.0000 ug | ORAL_TABLET | Freq: Every day | ORAL | 3 refills | Status: DC

## 2018-11-30 NOTE — Patient Instructions (Signed)

## 2018-11-30 NOTE — Progress Notes (Signed)
Endocrinology follow up Note       11/30/2018, 11:03 AM   Subjective:    Patient ID: Natasha Hinton, female    DOB: [birth year].  Natasha Hinton is being seen in follow up for management of currently uncontrolled symptomatic type 2 diabetes, hypothyroidism, hyperlipidemia, hypertension. PCP:   [name], PA-C.   Past Medical History:  Diagnosis Date  . Diabetes mellitus, type II ([place])   . Hyperlipidemia   . Hypertension    History reviewed. No pertinent surgical history. Social History   Socioeconomic History  . Marital status: Married    Spouse name: Not on file  . Number of children: Not on file  . Years of education: Not on file  . Highest education level: Not on file  Occupational History  . Not on file  Social Needs  . Financial resource strain: Not on file  . Food insecurity:    Worry: Not on file    Inability: Not on file  . Transportation needs:    Medical: Not on file    Non-medical: Not on file  Tobacco Use  . Smoking status: Never Smoker  . Smokeless tobacco: Never Used  Substance and Sexual Activity  . Alcohol use: Not Currently  . Drug use: Never  . Sexual activity: Not on file  Lifestyle  . Physical activity:    Days per week: Not on file    Minutes per session: Not on file  . Stress: Not on file  Relationships  . Social connections:    Talks on phone: Not on file    Gets together: Not on file    Attends religious service: Not on file    Active member of club or organization: Not on file    Attends meetings of clubs or organizations: Not on file    Relationship status: Not on file  Other Topics Concern  . Not on file  Social History Narrative  . Not on file   Outpatient Encounter Medications as of 11/30/2018  Medication Sig  . blood glucose meter kit and supplies KIT Dispense based on patient and insurance preference. Use up to four times daily as  directed. (FOR ICD-10 E11.65)  . Blood Glucose Monitoring Suppl (ACCU-CHEK GUIDE) w/Device KIT 1 Piece by Does not apply route as directed.  . cetirizine (ZYRTEC) 10 MG tablet daily.  . clotrimazole-betamethasone (LOTRISONE) cream daily.  . Ferrous Sulfate (IRON) 325 (65 Fe) MG TABS daily.  . furosemide (LASIX) 20 MG tablet daily.  [name] glucose blood (ACCU-CHEK GUIDE) test strip Use as instructed bid. E11.65  . HYDROcodone-acetaminophen (NORCO/VICODIN) 5-325 MG tablet 2 (two) times daily as needed.  . Insulin Glargine (LANTUS SOLOSTAR) 100 UNIT/ML Solostar Pen Inject 50 Units into the skin at bedtime.  . Insulin Pen Needle (B-D ULTRAFINE III SHORT PEN) 31G X 8 MM MISC 1 each by Does not apply route at bedtime.  [name] levothyroxine (SYNTHROID, LEVOTHROID) 88 MCG tablet Take 1 tablet (88 mcg total) by mouth daily before breakfast.  . metFORMIN (GLUCOPHAGE-XR) 500 MG 24 hr tablet Take 500 mg by mouth 2 (two) times daily with a meal.  .  metoprolol succinate (TOPROL-XL) 25 MG 24 hr tablet daily.  [name] omeprazole (PRILOSEC) 40 MG capsule daily.  . valsartan-hydrochlorothiazide (DIOVAN-HCT) 320-25 MG tablet TK 1 T PO QD  . [DISCONTINUED] levothyroxine (SYNTHROID, LEVOTHROID) 75 MCG tablet Take 1 tablet (75 mcg total) by mouth daily before breakfast.  . [DISCONTINUED] Pitavastatin Calcium (LIVALO) 2 MG TABS daily.   No facility-administered encounter medications on file as of 11/30/2018.     ALLERGIES: No Known Allergies  VACCINATION STATUS:  There is no immunization history on file for this patient.  Diabetes  She presents for her follow-up diabetic visit. She has type 2 diabetes mellitus. Onset time: Was diagnosed at approximate age of 2 years. Her disease course has been improving. There are no hypoglycemic associated symptoms. Pertinent negatives for hypoglycemia include no confusion, headaches, pallor or seizures. Associated symptoms include fatigue. Pertinent negatives for diabetes include no chest  pain, no polydipsia, no polyphagia and no polyuria. There are no hypoglycemic complications. Symptoms are improving. Risk factors for coronary artery disease include dyslipidemia, diabetes mellitus, obesity, hypertension, sedentary lifestyle and post-menopausal. Current diabetic treatment includes oral agent (dual therapy). Her weight is fluctuating minimally. She is following a generally unhealthy diet. When asked about meal planning, she reported none. She has not had a previous visit with a dietitian. She rarely participates in exercise. Her breakfast blood glucose range is generally 140-180 mg/dl. Her bedtime blood glucose range is generally 180-200 mg/dl. Her overall blood glucose range is 180-200 mg/dl. An ACE inhibitor/angiotensin II receptor blocker is being taken. She does not see a podiatrist.Eye exam is current.  Hyperlipidemia  This is a chronic problem. The current episode started more than 1 year ago. Exacerbating diseases include diabetes, hypothyroidism and obesity. Pertinent negatives include no chest pain, myalgias or shortness of breath. Risk factors for coronary artery disease include diabetes mellitus, dyslipidemia, hypertension, a sedentary lifestyle, post-menopausal, obesity and family history.  Hypertension  This is a chronic problem. The current episode started more than 1 year ago. Pertinent negatives include no chest pain, headaches, palpitations or shortness of breath. Risk factors for coronary artery disease include dyslipidemia, diabetes mellitus, obesity, sedentary lifestyle, family history and post-menopausal state. Past treatments include angiotensin blockers and beta blockers.    Review of Systems  Constitutional: Positive for fatigue. Negative for chills, fever and unexpected weight change.  HENT: Negative for trouble swallowing and voice change.   Eyes: Negative for visual disturbance.  Respiratory: Negative for cough, shortness of breath and wheezing.    Cardiovascular: Negative for chest pain, palpitations and leg swelling.  Gastrointestinal: Negative for diarrhea, nausea and vomiting.  Endocrine: Negative for cold intolerance, heat intolerance, polydipsia, polyphagia and polyuria.  Musculoskeletal: Negative for arthralgias and myalgias.  Skin: Negative for color change, pallor, rash and wound.  Neurological: Negative for seizures and headaches.  Psychiatric/Behavioral: Negative for confusion and suicidal ideas.    Objective:    BP (!) 155/90   Pulse 72   Ht _0  (1.676 m)   Wt 280 lb (127 kg)   BMI 45.19 kg/m   Wt Readings from Last 3 Encounters:  11/30/18 280 lb (127 kg)  08/29/18 258 lb (117 kg)  07/25/18 266 lb (120.7 kg)     Physical Exam Constitutional:      Appearance: She is well-developed.  HENT:     Head: Normocephalic and atraumatic.  Neck:     Musculoskeletal: Normal range of motion and neck supple.     Thyroid: No thyromegaly.  Trachea: No tracheal deviation.  Cardiovascular:     Rate and Rhythm: Normal rate and regular rhythm.  Pulmonary:     Effort: Pulmonary effort is normal.     Breath sounds: Normal breath sounds.  Abdominal:     General: Bowel sounds are normal.     Palpations: Abdomen is soft.     Tenderness: There is no abdominal tenderness. There is no guarding.  Musculoskeletal: Normal range of motion.  Skin:    General: Skin is warm and dry.     Coloration: Skin is not pale.     Findings: No erythema or rash.  Neurological:     Mental Status: She is alert and oriented to person, place, and time.     Cranial Nerves: No cranial nerve deficit.     Coordination: Coordination normal.     Deep Tendon Reflexes: Reflexes are normal and symmetric.  Psychiatric:        Judgment: Judgment normal.       Recent Results (from the past 2160 hour(s))  Basic metabolic panel     Status: None   Collection Time: 11/23/18 12:00 AM  Result Value Ref Range   BUN 11 4 - 21   Creatinine 0.9 0.5 - 1.1   Hemoglobin A1c     Status: None   Collection Time: 11/23/18 12:00 AM  Result Value Ref Range   Hemoglobin A1C 8.8   TSH     Status: None   Collection Time: 11/23/18 12:00 AM  Result Value Ref Range   TSH 3.84 0.41 - 5.90    Comment:  free t4 0.77   Lipid Panel     Component Value Date/Time   CHOL 278 (A) 08/22/2018   TRIG 98 08/22/2018   HDL 87 (A) 08/22/2018   LDLCALC 171 08/22/2018     Assessment & Plan:   1. Uncontrolled type 2 diabetes mellitus with hyperglycemia ([place])  - [place] has currently uncontrolled symptomatic type 2 DM since 68 years of age. -She presents with  improved fasting blood glucose profile.  Her previsit labs show improved A1c of 8.8%, overall improving from 10.6%.  He does not have documented or reported hypoglycemia.   -her diabetes is complicated by obesity/sedentary life and she remains at a high risk for more acute and chronic complications which include CAD, CVA, CKD, retinopathy, and neuropathy. These are all discussed in detail with her.  - I have counseled her on diet management and weight loss, by adopting a carbohydrate restricted/protein rich diet.  -She still admits to dietary indiscretions, including consumption of sweets and sweetened beverages. - Time spent with the patient: 25 min, of which >50% was spent in reviewing her blood glucose logs , discussing her hypoglycemia and hyperglycemia episodes, reviewing her current and  previous labs / studies and medications  doses and developing a plan to avoid hypoglycemia and hyperglycemia. Please refer to Patient Instructions for Blood Glucose Monitoring and Insulin/Medications Dosing Guide"  in media tab for additional information. [place] participated in the discussions, expressed understanding, and voiced agreement with the above plans.  All questions were answered to her satisfaction. she is encouraged to contact clinic should she have any questions or concerns prior to her  return visit.   - I encouraged her to switch to  unprocessed or minimally processed complex starch and increased protein intake (animal or plant source), fruits, and vegetables.  - she is advised to stick to a routine mealtimes to eat 3 meals  a  day and avoid unnecessary snacks ( to snack only to correct hypoglycemia).   - I have approached her with the following individualized plan to manage diabetes and patient agrees:   -Based on her current presentation with controlled fasting glycemic profile, she will continue on Lantus 50 units nightly, associated with monitoring of blood glucose 2 times a day-daily before breakfast and at bedtime.  She will not need prandial insulin for now.     -She is advised to continue metformin 500 mg p.o. twice daily.  - she is encouraged to call clinic for blood glucose levels less than 70 or above 300 mg /dl.  - she will be considered for incretin therapy as appropriate next visit. - Patient specific target  A1c;  LDL, HDL, Triglycerides, and  Waist Circumference were discussed in detail.  2) BP/HTN:  her blood pressure is not controlled to target.   she is advised to continue her current medications including losartan/HCTZ 320/25 mg p.o. daily with breakfast . 3) Lipids/HPL: Her recent lipid panel showed LDL at 171.  She reports better consistency taking her Livalo this time.  She is advised to continue Livalo 2 mg p.o. nightly.      4)  Weight/Diet:  Body mass index is 45.19 kg/m.  - clearly complicating her diabetes care.  I discussed with her the fact that loss of 5 - 10% of her  current body weight will have the most impact on her diabetes management.  CDE Consult will be initiated . Exercise, and detailed carbohydrates information provided  -  detailed on discharge instructions.  5) hypothyroidism: -Based on her previsit thyroid function test, she will benefit from higher dose of levothyroxine.  I discussed and increased her levothyroxine to 88 mcg  p.o. before breakfast.     - We discussed about the correct intake of her thyroid hormone, on empty stomach at fasting, with water, separated by at least 30 minutes from breakfast and other medications,  and separated by more than 4 hours from calcium, iron, multivitamins, acid reflux medications (PPIs). -Patient is made aware of the fact that thyroid hormone replacement is needed for life, dose to be adjusted by periodic monitoring of thyroid function tests.    6) Chronic Care/Health Maintenance:  -she  is on ACEI/ARB and Statin medications and  is encouraged to initiate and continue to follow up with Ophthalmology, Dentist,  Podiatrist at least yearly or according to recommendations, and advised to  stay away from smoking. I have recommended yearly flu vaccine and pneumonia vaccine at least every 5 years; moderate intensity exercise for up to 150 minutes weekly; and  sleep for at least 7 hours a day.  - I advised patient to maintain close follow up with [name], PA-C for primary care needs.  - Time spent with the patient: 25 min, of which >50% was spent in reviewing her blood glucose logs , discussing her hypoglycemia and hyperglycemia episodes, reviewing her current and  previous labs / studies and medications  doses and developing a plan to avoid hypoglycemia and hyperglycemia. Please refer to Patient Instructions for Blood Glucose Monitoring and Insulin/Medications Dosing Guide"  in media tab for additional information. [place] participated in the discussions, expressed understanding, and voiced agreement with the above plans.  All questions were answered to her satisfaction. she is encouraged to contact clinic should she have any questions or concerns prior to her return visit.   Follow up plan: - Return in about 3 months (around  02/28/2019) for Follow up with Pre-visit Labs, Meter, and Logs.  Glade Lloyd, MD [facility] [address] [place], [place] [address] Phone: [phone]  Fax: [phone]    11/30/2018, 11:03 AM  This note was partially dictated with voice recognition software. Similar sounding words can be transcribed inadequately or may not  be corrected upon review.

## 2019-01-13 ENCOUNTER — Other Ambulatory Visit: Payer: Self-pay | Admitting: "Endocrinology

## 2019-02-25 LAB — BASIC METABOLIC PANEL
BUN: 14 (ref 4–21)
Creatinine: 0.8 (ref 0.5–1.1)

## 2019-02-25 LAB — LIPID PANEL
Cholesterol: 266 — AB (ref 0–200)
HDL: 77 — AB (ref 35–70)
LDL Cholesterol: 168
Triglycerides: 106 (ref 40–160)

## 2019-02-25 LAB — HEMOGLOBIN A1C: Hemoglobin A1C: 11.2

## 2019-02-25 LAB — TSH: TSH: 2.78 (ref 0.41–5.90)

## 2019-03-02 ENCOUNTER — Encounter (INDEPENDENT_AMBULATORY_CARE_PROVIDER_SITE_OTHER): Payer: Self-pay

## 2019-03-02 ENCOUNTER — Ambulatory Visit (INDEPENDENT_AMBULATORY_CARE_PROVIDER_SITE_OTHER): Payer: Medicare Other | Admitting: "Endocrinology

## 2019-03-02 ENCOUNTER — Other Ambulatory Visit: Payer: Self-pay

## 2019-03-02 ENCOUNTER — Encounter: Payer: Self-pay | Admitting: "Endocrinology

## 2019-03-02 VITALS — BP 136/83 | HR 76 | Ht 66.0 in | Wt 295.0 lb

## 2019-03-02 DIAGNOSIS — E039 Hypothyroidism, unspecified: Secondary | ICD-10-CM | POA: Diagnosis not present

## 2019-03-02 DIAGNOSIS — E782 Mixed hyperlipidemia: Secondary | ICD-10-CM | POA: Diagnosis not present

## 2019-03-02 DIAGNOSIS — I10 Essential (primary) hypertension: Secondary | ICD-10-CM

## 2019-03-02 DIAGNOSIS — E1165 Type 2 diabetes mellitus with hyperglycemia: Secondary | ICD-10-CM

## 2019-03-02 MED ORDER — GLIPIZIDE ER 5 MG PO TB24: 5.0000 mg | ORAL_TABLET | Freq: Every day | ORAL | 3 refills | Status: DC

## 2019-03-02 NOTE — Patient Instructions (Signed)

## 2019-03-02 NOTE — Progress Notes (Signed)
Endocrinology follow up Note       03/02/2019, 10:25 AM   Subjective:    Patient ID: Natasha Hinton, female    DOB: [birth year].  [place] Lafuente is being seen in follow up for management of currently uncontrolled symptomatic type 2 diabetes, hypothyroidism, hyperlipidemia, hypertension. PCP:   [name], PA-C.   Past Medical History:  Diagnosis Date  . Diabetes mellitus, type II ([place])   . Hyperlipidemia   . Hypertension    History reviewed. No pertinent surgical history. Social History   Socioeconomic History  . Marital status: Married    Spouse name: Not on file  . Number of children: Not on file  . Years of education: Not on file  . Highest education level: Not on file  Occupational History  . Not on file  Social Needs  . Financial resource strain: Not on file  . Food insecurity:    Worry: Not on file    Inability: Not on file  . Transportation needs:    Medical: Not on file    Non-medical: Not on file  Tobacco Use  . Smoking status: Never Smoker  . Smokeless tobacco: Never Used  Substance and Sexual Activity  . Alcohol use: Not Currently  . Drug use: Never  . Sexual activity: Not on file  Lifestyle  . Physical activity:    Days per week: Not on file    Minutes per session: Not on file  . Stress: Not on file  Relationships  . Social connections:    Talks on phone: Not on file    Gets together: Not on file    Attends religious service: Not on file    Active member of club or organization: Not on file    Attends meetings of clubs or organizations: Not on file    Relationship status: Not on file  Other Topics Concern  . Not on file  Social History Narrative  . Not on file   Outpatient Encounter Medications as of 03/02/2019  Medication Sig  . amLODipine (NORVASC) 2.5 MG tablet Take 2.5 mg by mouth daily.  . blood glucose meter kit and supplies KIT Dispense based on  patient and insurance preference. Use up to four times daily as directed. (FOR ICD-10 E11.65)  . Blood Glucose Monitoring Suppl (ACCU-CHEK GUIDE) w/Device KIT 1 Piece by Does not apply route as directed.  . cetirizine (ZYRTEC) 10 MG tablet daily.  . clotrimazole-betamethasone (LOTRISONE) cream daily.  . Ferrous Sulfate (IRON) 325 (65 Fe) MG TABS daily.  . furosemide (LASIX) 20 MG tablet daily.  [name] glipiZIDE (GLUCOTROL XL) 5 MG 24 hr tablet Take 1 tablet (5 mg total) by mouth daily with breakfast.  . glucose blood (ACCU-CHEK GUIDE) test strip Use as instructed bid. E11.65  . HYDROcodone-acetaminophen (NORCO/VICODIN) 5-325 MG tablet 2 (two) times daily as needed.  . Insulin Pen Needle (B-D ULTRAFINE III SHORT PEN) 31G X 8 MM MISC 1 each by Does not apply route at bedtime.  [name] LANTUS SOLOSTAR 100 UNIT/ML Solostar Pen ADMINISTER 50 UNITS UNDER THE SKIN AT BEDTIME  . levothyroxine (SYNTHROID, LEVOTHROID) 88 MCG tablet Take 1  tablet (88 mcg total) by mouth daily before breakfast.  . metFORMIN (GLUCOPHAGE-XR) 500 MG 24 hr tablet Take 500 mg by mouth 2 (two) times daily with a meal.  . metoprolol succinate (TOPROL-XL) 25 MG 24 hr tablet daily.  [name] omeprazole (PRILOSEC) 40 MG capsule daily.  . valsartan-hydrochlorothiazide (DIOVAN-HCT) 320-25 MG tablet TK 1 T PO QD   No facility-administered encounter medications on file as of 03/02/2019.     ALLERGIES: No Known Allergies  VACCINATION STATUS:  There is no immunization history on file for this patient.  Diabetes  She presents for her follow-up diabetic visit. She has type 2 diabetes mellitus. Onset time: Was diagnosed at approximate age of 40 years. Her disease course has been worsening. There are no hypoglycemic associated symptoms. Pertinent negatives for hypoglycemia include no confusion, headaches, pallor or seizures. Associated symptoms include fatigue. Pertinent negatives for diabetes include no chest pain, no polydipsia, no polyphagia and no  polyuria. There are no hypoglycemic complications. Symptoms are worsening. Risk factors for coronary artery disease include dyslipidemia, diabetes mellitus, obesity, hypertension, sedentary lifestyle and post-menopausal. Current diabetic treatment includes oral agent (dual therapy). Her weight is fluctuating minimally. She is following a generally unhealthy diet. When asked about meal planning, she reported none. She has not had a previous visit with a dietitian. She rarely participates in exercise. Her breakfast blood glucose range is generally 140-180 mg/dl. Her bedtime blood glucose range is generally 180-200 mg/dl. Her overall blood glucose range is 180-200 mg/dl. An ACE inhibitor/angiotensin II receptor blocker is being taken. She does not see a podiatrist.Eye exam is current.  Hyperlipidemia  This is a chronic problem. The current episode started more than 1 year ago. Exacerbating diseases include diabetes, hypothyroidism and obesity. Pertinent negatives include no chest pain, myalgias or shortness of breath. Risk factors for coronary artery disease include diabetes mellitus, dyslipidemia, hypertension, a sedentary lifestyle, post-menopausal, obesity and family history.  Hypertension  This is a chronic problem. The current episode started more than 1 year ago. Pertinent negatives include no chest pain, headaches, palpitations or shortness of breath. Risk factors for coronary artery disease include dyslipidemia, diabetes mellitus, obesity, sedentary lifestyle, family history and post-menopausal state. Past treatments include angiotensin blockers and beta blockers.    Review of Systems  Constitutional: Positive for fatigue. Negative for chills, fever and unexpected weight change.  HENT: Negative for trouble swallowing and voice change.   Eyes: Negative for visual disturbance.  Respiratory: Negative for cough, shortness of breath and wheezing.   Cardiovascular: Negative for chest pain, palpitations  and leg swelling.  Gastrointestinal: Negative for diarrhea, nausea and vomiting.  Endocrine: Negative for cold intolerance, heat intolerance, polydipsia, polyphagia and polyuria.  Musculoskeletal: Negative for arthralgias and myalgias.  Skin: Negative for color change, pallor, rash and wound.  Neurological: Negative for seizures and headaches.  Psychiatric/Behavioral: Negative for confusion and suicidal ideas.    Objective:    BP 136/83   Pulse 76   Ht '5\' 6"'  (1.676 m)   Wt 295 lb (133.8 kg)   BMI 47.61 kg/m   Wt Readings from Last 3 Encounters:  03/02/19 295 lb (133.8 kg)  11/30/18 280 lb (127 kg)  08/29/18 258 lb (117 kg)     Physical Exam Constitutional:      Appearance: She is well-developed.  HENT:     Head: Normocephalic and atraumatic.  Neck:     Musculoskeletal: Normal range of motion and neck supple.     Thyroid: No thyromegaly.  Trachea: No tracheal deviation.  Pulmonary:     Effort: Pulmonary effort is normal.  Abdominal:     Tenderness: There is no abdominal tenderness. There is no guarding.  Musculoskeletal: Normal range of motion.  Skin:    General: Skin is warm and dry.     Coloration: Skin is not pale.     Findings: No erythema or rash.  Neurological:     Mental Status: She is alert and oriented to person, place, and time.     Cranial Nerves: No cranial nerve deficit.     Coordination: Coordination normal.     Deep Tendon Reflexes: Reflexes are normal and symmetric.  Psychiatric:     Comments: Patient has reluctant affect.       Recent Results (from the past 2160 hour(s))  Basic metabolic panel     Status: None   Collection Time: 02/25/19 12:00 AM  Result Value Ref Range   BUN 14 4 - 21   Creatinine 0.8 0.5 - 1.1  Lipid panel     Status: Abnormal   Collection Time: 02/25/19 12:00 AM  Result Value Ref Range   Triglycerides 106 40 - 160   Cholesterol 266 (A) 0 - 200   HDL 77 (A) 35 - 70   LDL Cholesterol 168   Hemoglobin A1c     Status:  None   Collection Time: 02/25/19 12:00 AM  Result Value Ref Range   Hemoglobin A1C 11.2   TSH     Status: None   Collection Time: 02/25/19 12:00 AM  Result Value Ref Range   TSH 2.78 0.41 - 5.90    Comment: ft4 0.94   Lipid Panel     Component Value Date/Time   CHOL 266 (A) 02/25/2019   TRIG 106 02/25/2019   HDL 77 (A) 02/25/2019   LDLCALC 168 02/25/2019     Assessment & Plan:   1. Uncontrolled type 2 diabetes mellitus with hyperglycemia ([place])  - [place] has currently uncontrolled symptomatic type 2 DM since 68 years of age. -She presents with loss of control of her postprandial glycemic profile, A1c of 11.2% increasing from 8.8%.  -her diabetes is complicated by obesity/sedentary life and she remains at a high risk for more acute and chronic complications which include CAD, CVA, CKD, retinopathy, and neuropathy. These are all discussed in detail with her.  - I have counseled her on diet management and weight loss, by adopting a carbohydrate restricted/protein rich diet.  - Patient admits there is a room for improvement in her diet and drink choices. -  Suggestion is made for her to avoid simple carbohydrates  from her diet including Cakes, Sweet Desserts / Pastries, Ice Cream, Soda (diet and regular), Sweet Tea, Candies, Chips, Cookies, Store Bought Juices, Alcohol in Excess of  1-2 drinks a day, Artificial Sweeteners, and "Sugar-free" Products. This will help patient to have stable blood glucose profile and potentially avoid unintended weight gain.   - I encouraged her to switch to  unprocessed or minimally processed complex starch and increased protein intake (animal or plant source), fruits, and vegetables.  - she is advised to stick to a routine mealtimes to eat 3 meals  a day and avoid unnecessary snacks ( to snack only to correct hypoglycemia).   - I have approached her with the following individualized plan to manage diabetes and patient agrees:   -Based on  her current presentation with controlled fasting glycemic profile, she will not tolerate any higher dose of  Lantus.  However she will need addition of prandial insulin in order for her to control diabetes to target.  She is planning a trip and would like to delay the addition of prandial insulin for now.    -She is given the option of adding glipizide 5 mg XL p.o. daily with breakfast, continue Lantus 50 units nightly associated with monitoring of blood glucose 2 times a day before breakfast and at bedtime.   -If her A1c remains above 10% during her next visit, she will be considered for prandial insulin. -She is advised to continue metformin 500 mg p.o. twice daily.  - she is encouraged to call clinic for blood glucose levels less than 70 or above 300 mg /dl.  - Patient specific target  A1c;  LDL, HDL, Triglycerides, and  Waist Circumference were discussed in detail.  2) BP/HTN:  her blood pressure is not controlled to target.   she is advised to continue her current medications including losartan/HCTZ 320/25 mg p.o. daily with breakfast . 3) Lipids/HPL: Her recent lipid panel showed LDL at 171.  She reports better consistency taking her Livalo this time.  She is advised to continue Livalo 2 mg p.o. nightly.      4)  Weight/Diet:  Body mass index is 47.61 kg/m.  - clearly complicating her diabetes care.  I discussed with her the fact that loss of 5 - 10% of her  current body weight will have the most impact on her diabetes management.  CDE Consult will be initiated . Exercise, and detailed carbohydrates information provided  -  detailed on discharge instructions.  5) hypothyroidism: -Based on her previsit thyroid function test, she will benefit from higher dose of levothyroxine.  She is advised to continue levothyroxine 88 mcg p.o. every morning.   - We discussed about the correct intake of her thyroid hormone, on empty stomach at fasting, with water, separated by at least 30 minutes from  breakfast and other medications,  and separated by more than 4 hours from calcium, iron, multivitamins, acid reflux medications (PPIs). -Patient is made aware of the fact that thyroid hormone replacement is needed for life, dose to be adjusted by periodic monitoring of thyroid function tests.   6) Chronic Care/Health Maintenance:  -she  is on ACEI/ARB and Statin medications and  is encouraged to initiate and continue to follow up with Ophthalmology, Dentist,  Podiatrist at least yearly or according to recommendations, and advised to  stay away from smoking. I have recommended yearly flu vaccine and pneumonia vaccine at least every 5 years; moderate intensity exercise for up to 150 minutes weekly; and  sleep for at least 7 hours a day.  - I advised patient to maintain close follow up with [name], PA-C for primary care needs.  - Time spent with the patient: 25 min, of which >50% was spent in reviewing her blood glucose logs , discussing her hypoglycemia and hyperglycemia episodes, reviewing her current and  previous labs / studies and medications  doses and developing a plan to avoid hypoglycemia and hyperglycemia. Please refer to Patient Instructions for Blood Glucose Monitoring and Insulin/Medications Dosing Guide"  in media tab for additional information. Please  also refer to " Patient Self Inventory" in the Media  tab for reviewed elements of pertinent patient history.  [place] participated in the discussions, expressed understanding, and voiced agreement with the above plans.  All questions were answered to her satisfaction. she is encouraged to contact clinic should she  have any questions or concerns prior to her return visit.   Follow up plan: - Return in about 3 months (around 06/02/2019) for Follow up with Pre-visit Labs, Meter, and Logs.  Glade Lloyd, MD [facility] [address] [place],  [place] [address] Phone:  [phone]  Fax: [phone]    03/02/2019, 10:25 AM  This note was partially dictated with voice recognition software. Similar sounding words can be transcribed inadequately or may not  be corrected upon review.

## 2019-03-28 ENCOUNTER — Other Ambulatory Visit: Payer: Self-pay | Admitting: "Endocrinology

## 2019-04-07 ENCOUNTER — Other Ambulatory Visit: Payer: Self-pay | Admitting: "Endocrinology

## 2019-04-14 ENCOUNTER — Other Ambulatory Visit: Payer: Self-pay | Admitting: "Endocrinology

## 2019-05-30 LAB — HEMOGLOBIN A1C: Hemoglobin A1C: 7.3

## 2019-05-30 LAB — BASIC METABOLIC PANEL
BUN: 15 (ref 4–21)
Creatinine: 0.8 (ref 0.5–1.1)

## 2019-06-05 ENCOUNTER — Ambulatory Visit (INDEPENDENT_AMBULATORY_CARE_PROVIDER_SITE_OTHER): Payer: Medicare Other | Admitting: "Endocrinology

## 2019-06-05 ENCOUNTER — Encounter: Payer: Self-pay | Admitting: "Endocrinology

## 2019-06-05 ENCOUNTER — Other Ambulatory Visit: Payer: Self-pay

## 2019-06-05 DIAGNOSIS — E039 Hypothyroidism, unspecified: Secondary | ICD-10-CM

## 2019-06-05 DIAGNOSIS — I10 Essential (primary) hypertension: Secondary | ICD-10-CM | POA: Diagnosis not present

## 2019-06-05 DIAGNOSIS — E1165 Type 2 diabetes mellitus with hyperglycemia: Secondary | ICD-10-CM | POA: Diagnosis not present

## 2019-06-05 DIAGNOSIS — E782 Mixed hyperlipidemia: Secondary | ICD-10-CM | POA: Diagnosis not present

## 2019-06-05 NOTE — Progress Notes (Signed)
Endocrinology Telehealth Visit Follow up Note -During COVID -19 Pandemic  This visit type was conducted due to national recommendations for restrictions regarding the COVID-19 Pandemic  in an effort to limit this patient's exposure and mitigate transmission of the corona virus.  Due to her co-morbid illnesses, Natasha Hinton is at  moderate to high risk for complications without adequate follow up.  This format is felt to be most appropriate for her at this time.  I connected with this patient on 06/05/2019   by telephone and verified that I am speaking with the correct person using two identifiers. Natasha Hinton, [birth year] she has verbally consented to this visit. All issues noted in this document were discussed and addressed. The format was not optimal for physical exam.      06/05/2019, 6:11 PM   Subjective:    Patient ID: Natasha Hinton, female    DOB: October 07, 1950.  Natasha Hinton is being engaged in telehealth via telephone in follow up for management of currently uncontrolled symptomatic type 2 diabetes, hypothyroidism, hyperlipidemia, hypertension. PCP:   [name], PA-C.   Past Medical History:  Diagnosis Date  . Diabetes mellitus, type II ([place])   . Hyperlipidemia   . Hypertension    History reviewed. No pertinent surgical history. Social History   Socioeconomic History  . Marital status: Married    Spouse name: Not on file  . Number of children: Not on file  . Years of education: Not on file  . Highest education level: Not on file  Occupational History  . Not on file  Social Needs  . Financial resource strain: Not on file  . Food insecurity    Worry: Not on file    Inability: Not on file  . Transportation needs    Medical: Not on file    Non-medical: Not on file  Tobacco Use  . Smoking status: Never Smoker  .  Smokeless tobacco: Never Used  Substance and Sexual Activity  . Alcohol use: Not Currently  . Drug use: Never  . Sexual activity: Not on file  Lifestyle  . Physical activity    Days per week: Not on file    Minutes per session: Not on file  . Stress: Not on file  Relationships  . Social Herbalist on phone: Not on file    Gets together: Not on file    Attends religious service: Not on file    Active member of club or organization: Not on file    Attends meetings of clubs or organizations: Not on file    Relationship status: Not on file  Other Topics Concern  . Not on file  Social History Narrative  . Not on file   Outpatient Encounter Medications as of 06/05/2019  Medication Sig  . ACCU-CHEK GUIDE test strip USE TO TEST BLOOD  SUGAR TWICE DAILY  . amLODipine (NORVASC) 2.5 MG tablet Take 2.5 mg by mouth daily.  . blood glucose meter kit and supplies KIT Dispense based on patient and insurance preference. Use up to four times daily as directed. (FOR ICD-10 E11.65)  . Blood Glucose Monitoring Suppl (ACCU-CHEK GUIDE) w/Device KIT 1 Piece by Does not apply route as directed.  . cetirizine (ZYRTEC) 10 MG tablet daily.  . clotrimazole-betamethasone (LOTRISONE) cream daily.  . Ferrous Sulfate (IRON) 325 (65 Fe) MG TABS daily.  . furosemide (LASIX) 20 MG tablet daily.  [name] glipiZIDE (GLUCOTROL XL) 5 MG 24 hr tablet Take 1 tablet (5 mg total) by mouth daily with breakfast.  . HYDROcodone-acetaminophen (NORCO/VICODIN) 5-325 MG tablet 2 (two) times daily as needed.  . Insulin Pen Needle (B-D ULTRAFINE III SHORT PEN) 31G X 8 MM MISC 1 each by Does not apply route at bedtime.  [name] LANTUS SOLOSTAR 100 UNIT/ML Solostar Pen ADMINISTER 50 UNITS UNDER THE SKIN AT BEDTIME  . levothyroxine (SYNTHROID) 88 MCG tablet TAKE 1 TABLET(88 MCG) BY MOUTH DAILY BEFORE BREAKFAST  . metFORMIN (GLUCOPHAGE-XR) 500 MG 24 hr tablet Take 500 mg by mouth 2 (two) times daily with a meal.  . metoprolol succinate  (TOPROL-XL) 25 MG 24 hr tablet daily.  [name] omeprazole (PRILOSEC) 40 MG capsule daily.  . valsartan-hydrochlorothiazide (DIOVAN-HCT) 320-25 MG tablet TK 1 T PO QD   No facility-administered encounter medications on file as of 06/05/2019.     ALLERGIES: No Known Allergies  VACCINATION STATUS:  There is no immunization history on file for this patient.  Diabetes She presents for her follow-up diabetic visit. She has type 2 diabetes mellitus. Onset time: Was diagnosed at approximate age of 68 years. Her disease course has been improving. There are no hypoglycemic associated symptoms. Pertinent negatives for hypoglycemia include no confusion, headaches, pallor or seizures. Associated symptoms include fatigue. Pertinent negatives for diabetes include no chest pain, no polydipsia, no polyphagia and no polyuria. There are no hypoglycemic complications. Symptoms are improving. Risk factors for coronary artery disease include dyslipidemia, diabetes mellitus, obesity, hypertension, sedentary lifestyle and post-menopausal. Current diabetic treatment includes oral agent (dual therapy). Her weight is fluctuating minimally. She is following a generally unhealthy diet. When asked about meal planning, she reported none. She has not had a previous visit with a dietitian. She rarely participates in exercise. Her breakfast blood glucose range is generally 140-180 mg/dl. Her bedtime blood glucose range is generally 140-180 mg/dl. Her overall blood glucose range is 140-180 mg/dl. An ACE inhibitor/angiotensin II receptor blocker is being taken. She does not see a podiatrist.Eye exam is current.  Hyperlipidemia This is a chronic problem. The current episode started more than 1 year ago. Exacerbating diseases include diabetes, hypothyroidism and obesity. Pertinent negatives include no chest pain, myalgias or shortness of breath. Risk factors for coronary artery disease include diabetes mellitus, dyslipidemia, hypertension, a  sedentary lifestyle, post-menopausal, obesity and family history.  Hypertension This is a chronic problem. The current episode started more than 1 year ago. Pertinent negatives include no chest pain, headaches, palpitations or shortness of breath. Risk factors for coronary artery disease include dyslipidemia, diabetes mellitus, obesity, sedentary lifestyle, family history and post-menopausal state. Past treatments include angiotensin blockers and beta blockers.    Review of Systems  Constitutional: Positive for fatigue. Negative for chills, fever and unexpected weight change.  HENT: Negative for trouble swallowing and voice change.   Eyes: Negative for visual disturbance.  Respiratory: Negative for cough, shortness of breath  and wheezing.   Cardiovascular: Negative for chest pain, palpitations and leg swelling.  Gastrointestinal: Negative for diarrhea, nausea and vomiting.  Endocrine: Negative for cold intolerance, heat intolerance, polydipsia, polyphagia and polyuria.  Musculoskeletal: Negative for arthralgias and myalgias.  Skin: Negative for color change, pallor, rash and wound.  Neurological: Negative for seizures and headaches.  Psychiatric/Behavioral: Negative for confusion and suicidal ideas.    Objective:    There were no vitals taken for this visit.  Wt Readings from Last 3 Encounters:  03/02/19 295 lb (133.8 kg)  11/30/18 280 lb (127 kg)  08/29/18 258 lb (117 kg)        Recent Results (from the past 2160 hour(s))  Basic metabolic panel     Status: None   Collection Time: 05/29/19 12:00 AM  Result Value Ref Range   BUN 15 4 - 21   Creatinine 0.8 0.5 - 1.1  Hemoglobin A1c     Status: None   Collection Time: 05/29/19 12:00 AM  Result Value Ref Range   Hemoglobin A1C 7.3    Lipid Panel     Component Value Date/Time   CHOL 266 (A) 02/25/2019   TRIG 106 02/25/2019   HDL 77 (A) 02/25/2019   LDLCALC 168 02/25/2019     Assessment & Plan:   1. Uncontrolled type 2  diabetes mellitus with hyperglycemia ([place])  - [place] has currently uncontrolled symptomatic type 2 DM since 68 years of age. -She reports control of glycemia towards target between 71 and 159 at fasting with A1c of 7.3% significantly improving from 11.2% during her last visit.    -her diabetes is complicated by obesity/sedentary life and she remains at a high risk for more acute and chronic complications which include CAD, CVA, CKD, retinopathy, and neuropathy. These are all discussed in detail with her.  - I have counseled her on diet management and weight loss, by adopting a carbohydrate restricted/protein rich diet. - she  admits there is a room for improvement in her diet and drink choices. -  Suggestion is made for her to avoid simple carbohydrates  from her diet including Cakes, Sweet Desserts / Pastries, Ice Cream, Soda (diet and regular), Sweet Tea, Candies, Chips, Cookies, Sweet Pastries,  Store Bought Juices, Alcohol in Excess of  1-2 drinks a day, Artificial Sweeteners, Coffee Creamer, and "Sugar-free" Products. This will help patient to have stable blood glucose profile and potentially avoid unintended weight gain.  - I encouraged her to switch to  unprocessed or minimally processed complex starch and increased protein intake (animal or plant source), fruits, and vegetables.  - she is advised to stick to a routine mealtimes to eat 3 meals  a day and avoid unnecessary snacks ( to snack only to correct hypoglycemia).   - I have approached her with the following individualized plan to manage diabetes and patient agrees:   -Based on her current presentation with controlled fasting glycemic profile, she will not need prandial insulin for now. -She is advised to continue Lantus 50 units nightly,   associated with monitoring of blood glucose 2 times a day before breakfast and at bedtime.   -She has responded to addition of glipizide treatment.  She is advised to continue glipizide  5 mg XL p.o. daily at breakfast, and metformin 500 mg p.o. daily after breakfast and supper.    - she is encouraged to call clinic for blood glucose levels less than 70 or above 200 mg /dl.  - Patient specific target  A1c;  LDL, HDL, Triglycerides, and  Waist Circumference were discussed in detail.  2) BP/HTN:  she is advised to home monitor blood pressure and report if > 140/90 on 2 separate readings.    she is advised to continue her current medications including losartan/HCTZ 320/25 mg p.o. daily with breakfast . 3) Lipids/HPL: Her recent lipid panel showed LDL at 171.  She reports better   consistency taking her Livalo this time.  She is advised to continue Livalo 2 mg p.o. nightly.     4)  Weight/Diet: Her BMI is 47- clearly complicating her diabetes care.  I discussed with her the fact that loss of 5 - 10% of her  current body weight will have the most impact on her diabetes management.  CDE Consult will be initiated . Exercise, and detailed carbohydrates information provided  -  detailed on discharge instructions.  5) hypothyroidism:  She is advised to continue levothyroxine 88 mcg p.o. daily before breakfast.  - We discussed about the correct intake of her thyroid hormone, on empty stomach at fasting, with water, separated by at least 30 minutes from breakfast and other medications,  and separated by more than 4 hours from calcium, iron, multivitamins, acid reflux medications (PPIs). -Patient is made aware of the fact that thyroid hormone replacement is needed for life, dose to be adjusted by periodic monitoring of thyroid function tests.   6) Chronic Care/Health Maintenance:  -she  is on ACEI/ARB and Statin medications and  is encouraged to initiate and continue to follow up with Ophthalmology, Dentist,  Podiatrist at least yearly or according to recommendations, and advised to  stay away from smoking. I have recommended yearly flu vaccine and pneumonia vaccine at least every 5  years; moderate intensity exercise for up to 150 minutes weekly; and  sleep for at least 7 hours a day.  - I advised patient to maintain close follow up with [name], PA-C for primary care needs.  - Patient Care Time Today:  25 min, of which >50% was spent in  counseling and the rest reviewing her  current and  previous labs/studies, previous treatments, her blood glucose readings, and medications' doses and developing a plan for long-term care based on the latest recommendations for standards of care.   [place] participated in the discussions, expressed understanding, and voiced agreement with the above plans.  All questions were answered to her satisfaction. she is encouraged to contact clinic should she have any questions or concerns prior to her return visit.  Follow up plan: - Return in about 4 months (around 10/05/2019), or logs 6, for Bring Meter and Logs- A1c in Office.  Glade Lloyd, MD [facility] [address] [place], [place] [address] Phone: [phone]  Fax: [phone]    06/05/2019, 6:11 PM  This note was partially dictated with voice recognition software. Similar sounding words can be transcribed inadequately or may not  be corrected upon review.

## 2019-06-25 ENCOUNTER — Other Ambulatory Visit: Payer: Self-pay | Admitting: "Endocrinology

## 2019-07-03 ENCOUNTER — Other Ambulatory Visit: Payer: Self-pay | Admitting: "Endocrinology

## 2019-08-24 ENCOUNTER — Ambulatory Visit (INDEPENDENT_AMBULATORY_CARE_PROVIDER_SITE_OTHER): Payer: Medicare Other | Admitting: "Endocrinology

## 2019-08-24 ENCOUNTER — Encounter: Payer: Self-pay | Admitting: "Endocrinology

## 2019-08-24 ENCOUNTER — Other Ambulatory Visit: Payer: Self-pay

## 2019-08-24 DIAGNOSIS — E162 Hypoglycemia, unspecified: Secondary | ICD-10-CM | POA: Diagnosis not present

## 2019-08-24 DIAGNOSIS — E1165 Type 2 diabetes mellitus with hyperglycemia: Secondary | ICD-10-CM

## 2019-08-24 MED ORDER — LANTUS SOLOSTAR 100 UNIT/ML ~~LOC~~ SOPN: 30.0000 [IU] | PEN_INJECTOR | Freq: Every day | SUBCUTANEOUS | 2 refills | Status: DC

## 2019-08-24 NOTE — Progress Notes (Signed)
08/24/2019                                                       Endocrinology Telehealth Visit Follow up Note -During COVID -19 Pandemic  This visit type was conducted due to national recommendations for restrictions regarding the COVID-19 Pandemic  in an effort to limit this patient's exposure and mitigate transmission of the corona virus.  Due to her co-morbid illnesses, [place] is at  moderate to high risk for complications without adequate follow up.  This format is felt to be most appropriate for her at this time.  I connected with this patient on 08/24/2019   by telephone and verified that I am speaking with the correct person using two identifiers. [place], August 03, 1951. she has verbally consented to this visit. All issues noted in this document were discussed and addressed. The format was not optimal for physical exam.     Subjective:    Patient ID: Natasha Hinton, female    DOB: Feb 19, 1951.  Natasha Hinton is being engaged in telehealth via telephone in follow up for management of currently uncontrolled symptomatic type 2 diabetes, hypothyroidism, hyperlipidemia, hypertension. PCP:   [name], PA-C.   Past Medical History:  Diagnosis Date  . Diabetes mellitus, type II ([place])   . Hyperlipidemia   . Hypertension    History reviewed. No pertinent surgical history. Social History   Socioeconomic History  . Marital status: Married    Spouse name: Not on file  . Number of children: Not on file  . Years of education: Not on file  . Highest education level: Not on file  Occupational History  . Not on file  Social Needs  . Financial resource strain: Not on file  . Food insecurity    Worry: Not on file    Inability: Not on file  . Transportation needs    Medical: Not on file    Non-medical: Not on file  Tobacco Use  . Smoking status: Never Smoker  . Smokeless tobacco:  Never Used  Substance and Sexual Activity  . Alcohol use: Not Currently  . Drug use: Never  . Sexual activity: Not on file  Lifestyle  . Physical activity    Days per week: Not on file    Minutes per session: Not on file  . Stress: Not on file  Relationships  . Social Herbalist on phone: Not on file    Gets together: Not on file    Attends religious service: Not on file    Active member of club or organization: Not on file    Attends meetings of clubs or organizations: Not on file    Relationship status: Not on file  Other Topics Concern  . Not on file  Social History Narrative  . Not on file   Outpatient Encounter Medications as of 08/24/2019  Medication Sig  . ACCU-CHEK GUIDE test strip USE TO TEST BLOOD SUGAR TWICE DAILY  .  amLODipine (NORVASC) 2.5 MG tablet Take 2.5 mg by mouth daily.  . blood glucose meter kit and supplies KIT Dispense based on patient and insurance preference. Use up to four times daily as directed. (FOR ICD-10 E11.65)  . Blood Glucose Monitoring Suppl (ACCU-CHEK GUIDE) w/Device KIT 1 Piece by Does not apply route as directed.  . cetirizine (ZYRTEC) 10 MG tablet daily.  . clotrimazole-betamethasone (LOTRISONE) cream daily.  . Ferrous Sulfate (IRON) 325 (65 Fe) MG TABS daily.  . furosemide (LASIX) 20 MG tablet daily.  [name] HYDROcodone-acetaminophen (NORCO/VICODIN) 5-325 MG tablet 2 (two) times daily as needed.  . Insulin Glargine (LANTUS SOLOSTAR) 100 UNIT/ML Solostar Pen Inject 30 Units into the skin at bedtime.  . Insulin Pen Needle (B-D ULTRAFINE III SHORT PEN) 31G X 8 MM MISC 1 each by Does not apply route at bedtime.  [name] levothyroxine (SYNTHROID) 88 MCG tablet TAKE 1 TABLET(88 MCG) BY MOUTH DAILY BEFORE BREAKFAST  . metFORMIN (GLUCOPHAGE-XR) 500 MG 24 hr tablet Take 500 mg by mouth 2 (two) times daily with a meal.  . metoprolol succinate (TOPROL-XL) 25 MG 24 hr tablet daily.  [name] omeprazole (PRILOSEC) 40 MG capsule daily.  .  valsartan-hydrochlorothiazide (DIOVAN-HCT) 320-25 MG tablet TK 1 T PO QD  . [DISCONTINUED] glipiZIDE (GLUCOTROL XL) 5 MG 24 hr tablet TAKE 1 TABLET BY MOUTH DAILY WITH BREAKFAST  . [DISCONTINUED] LANTUS SOLOSTAR 100 UNIT/ML Solostar Pen ADMINISTER 50 UNITS UNDER THE SKIN AT BEDTIME   No facility-administered encounter medications on file as of 08/24/2019.     ALLERGIES: No Known Allergies  VACCINATION STATUS:  There is no immunization history on file for this patient.  Diabetes She presents for her follow-up diabetic visit. She has type 2 diabetes mellitus. Onset time: Was diagnosed at approximate age of 48 years. Her disease course has been worsening (Called because of random hypoglycemia after she was started on glipizide.). Hypoglycemia symptoms include nervousness/anxiousness, sweats and tremors. Pertinent negatives for hypoglycemia include no confusion, headaches, pallor or seizures. Associated symptoms include fatigue. Pertinent negatives for diabetes include no chest pain, no polydipsia, no polyphagia and no polyuria. There are no hypoglycemic complications. Symptoms are worsening. Risk factors for coronary artery disease include dyslipidemia, diabetes mellitus, obesity, hypertension, sedentary lifestyle and post-menopausal. Current diabetic treatment includes oral agent (dual therapy). She is following a generally unhealthy diet. When asked about meal planning, she reported none. She has not had a previous visit with a dietitian. She rarely participates in exercise. Her home blood glucose trend is decreasing steadily. Her breakfast blood glucose range is generally 70-90 mg/dl. An ACE inhibitor/angiotensin II receptor blocker is being taken. She does not see a podiatrist.Eye exam is current.  Hyperlipidemia This is a chronic problem. The current episode started more than 1 year ago. Exacerbating diseases include diabetes, hypothyroidism and obesity. Pertinent negatives include no chest pain,  myalgias or shortness of breath. Risk factors for coronary artery disease include diabetes mellitus, dyslipidemia, hypertension, a sedentary lifestyle, post-menopausal, obesity and family history.  Hypertension This is a chronic problem. The current episode started more than 1 year ago. Associated symptoms include sweats. Pertinent negatives include no chest pain, headaches, palpitations or shortness of breath. Risk factors for coronary artery disease include dyslipidemia, diabetes mellitus, obesity, sedentary lifestyle, family history and post-menopausal state. Past treatments include angiotensin blockers and beta blockers.    Review of Systems  Constitutional: Positive for fatigue. Negative for chills, fever and unexpected weight change.  HENT: Negative for trouble swallowing and voice change.  Eyes: Negative for visual disturbance.  Respiratory: Negative for cough, shortness of breath and wheezing.   Cardiovascular: Negative for chest pain, palpitations and leg swelling.  Gastrointestinal: Negative for diarrhea, nausea and vomiting.  Endocrine: Negative for cold intolerance, heat intolerance, polydipsia, polyphagia and polyuria.  Musculoskeletal: Negative for arthralgias and myalgias.  Skin: Negative for color change, pallor, rash and wound.  Neurological: Positive for tremors. Negative for seizures and headaches.  Psychiatric/Behavioral: Negative for confusion and suicidal ideas. The patient is nervous/anxious.     Objective:    There were no vitals taken for this visit.  Wt Readings from Last 3 Encounters:  03/02/19 295 lb (133.8 kg)  11/30/18 280 lb (127 kg)  08/29/18 258 lb (117 kg)        Recent Results (from the past 2160 hour(s))  Basic metabolic panel     Status: None   Collection Time: 05/29/19 12:00 AM  Result Value Ref Range   BUN 15 4 - 21   Creatinine 0.8 0.5 - 1.1  Hemoglobin A1c     Status: None   Collection Time: 05/29/19 12:00 AM  Result Value Ref Range    Hemoglobin A1C 7.3    Lipid Panel     Component Value Date/Time   CHOL 266 (A) 02/25/2019   TRIG 106 02/25/2019   HDL 77 (A) 02/25/2019   LDLCALC 168 02/25/2019     Assessment & Plan:   1. Uncontrolled type 2 diabetes mellitus with hyperglycemia [facility])  - Natasha Hinton has currently uncontrolled symptomatic type 2 DM since 68 years of age. -She called in and being worked in for hypoglycemia.  410 blood glucose readings in the 60s with symptoms of hypoglycemia.   -her diabetes is complicated by obesity/sedentary life and she remains at a high risk for more acute and chronic complications which include CAD, CVA, CKD, retinopathy, and neuropathy. These are all discussed in detail with her.  - I have counseled her on diet management and weight loss, by adopting a carbohydrate restricted/protein rich diet.  - she  admits there is a room for improvement in her diet and drink choices. -  Suggestion is made for her to avoid simple carbohydrates  from her diet including Cakes, Sweet Desserts / Pastries, Ice Cream, Soda (diet and regular), Sweet Tea, Candies, Chips, Cookies, Sweet Pastries,  Store Bought Juices, Alcohol in Excess of  1-2 drinks a day, Artificial Sweeteners, Coffee Creamer, and "Sugar-free" Products. This will help patient to have stable blood glucose profile and potentially avoid unintended weight gain.   - I encouraged her to switch to  unprocessed or minimally processed complex starch and increased protein intake (animal or plant source), fruits, and vegetables.  - she is advised to stick to a routine mealtimes to eat 3 meals  a day and avoid unnecessary snacks ( to snack only to correct hypoglycemia).   - I have approached her with the following individualized plan to manage diabetes and patient agrees:   -She has responded to addition of glipizide with hypoglycemia.  She is advised to lower her Lantus to 30 units nightly,  associated with monitoring of blood glucose 2  times a day before breakfast and at bedtime.   -  She is advised to continue glipizide 5 mg XL p.o. daily at breakfast, and metformin 500 mg p.o. daily after breakfast and supper. -She is advised to discontinue the glipizide treatment for now.   - she is encouraged to call clinic for blood glucose levels  less than 70 or above 200 mg /dl.  - Patient specific target  A1c;  LDL, HDL, Triglycerides, and  Waist Circumference were discussed in detail.  2) BP/HTN:  she is advised to home monitor blood pressure and report if > 140/90 on 2 separate readings.    she is advised to continue her current medications including losartan/HCTZ 320/25 mg p.o. daily with breakfast . 3) Lipids/HPL: Her recent lipid panel showed LDL at 171.  She reports better   consistency taking her Livalo this time.  She is advised to continue Livalo 2 mg p.o. nightly.     4)  Weight/Diet: Her BMI is 47- clearly complicating her diabetes care.  I discussed with her the fact that loss of 5 - 10% of her  current body weight will have the most impact on her diabetes management.  CDE Consult will be initiated . Exercise, and detailed carbohydrates information provided  -  detailed on discharge instructions.  5) hypothyroidism:  She is advised to continue levothyroxine 88 mcg p.o. daily before breakfast.  - We discussed about the correct intake of her thyroid hormone, on empty stomach at fasting, with water, separated by at least 30 minutes from breakfast and other medications,  and separated by more than 4 hours from calcium, iron, multivitamins, acid reflux medications (PPIs). -Patient is made aware of the fact that thyroid hormone replacement is needed for life, dose to be adjusted by periodic monitoring of thyroid function tests.   6) Chronic Care/Health Maintenance:  -she  is on ACEI/ARB and Statin medications and  is encouraged to initiate and continue to follow up with Ophthalmology, Dentist,  Podiatrist at least yearly or  according to recommendations, and advised to  stay away from smoking. I have recommended yearly flu vaccine and pneumonia vaccine at least every 5 years; moderate intensity exercise for up to 150 minutes weekly; and  sleep for at least 7 hours a day.  - I advised patient to maintain close follow up with [name], PA-C for primary care needs.   Time for this visit: 15 minutes. Natasha Hinton  participated in the discussions, expressed understanding, and voiced agreement with the above plans.  All questions were answered to her satisfaction. she is encouraged to contact clinic should she have any questions or concerns prior to her return visit.   Follow up plan: - Return keep her regular appointment.  Glade Lloyd, MD [facility] [address] [place],  [address] Phone: [phone]  Fax: [phone]    08/24/2019, 5:46 PM  This note was partially dictated with voice recognition software. Similar sounding words can be transcribed inadequately or may not  be corrected upon review.

## 2019-09-13 ENCOUNTER — Other Ambulatory Visit: Payer: Self-pay | Admitting: "Endocrinology

## 2019-10-01 ENCOUNTER — Other Ambulatory Visit: Payer: Self-pay | Admitting: "Endocrinology

## 2019-10-10 ENCOUNTER — Encounter: Payer: Self-pay | Admitting: "Endocrinology

## 2019-10-10 ENCOUNTER — Other Ambulatory Visit: Payer: Self-pay

## 2019-10-10 ENCOUNTER — Ambulatory Visit (INDEPENDENT_AMBULATORY_CARE_PROVIDER_SITE_OTHER): Payer: Medicare Other | Admitting: "Endocrinology

## 2019-10-10 VITALS — BP 183/92 | HR 73 | Ht 66.0 in | Wt 296.0 lb

## 2019-10-10 DIAGNOSIS — E039 Hypothyroidism, unspecified: Secondary | ICD-10-CM | POA: Diagnosis not present

## 2019-10-10 DIAGNOSIS — I10 Essential (primary) hypertension: Secondary | ICD-10-CM

## 2019-10-10 DIAGNOSIS — E782 Mixed hyperlipidemia: Secondary | ICD-10-CM

## 2019-10-10 DIAGNOSIS — E1165 Type 2 diabetes mellitus with hyperglycemia: Secondary | ICD-10-CM

## 2019-10-10 LAB — POCT GLYCOSYLATED HEMOGLOBIN (HGB A1C): Hemoglobin A1C: 9.3 % — AB (ref 4.0–5.6)

## 2019-10-10 MED ORDER — LANTUS SOLOSTAR 100 UNIT/ML ~~LOC~~ SOPN: 40.0000 [IU] | PEN_INJECTOR | Freq: Every day | SUBCUTANEOUS | 2 refills | Status: DC

## 2019-10-10 MED ORDER — GLIPIZIDE ER 5 MG PO TB24: 5.0000 mg | ORAL_TABLET | Freq: Every day | ORAL | 3 refills | Status: DC

## 2019-10-10 NOTE — Patient Instructions (Signed)

## 2019-10-10 NOTE — Progress Notes (Signed)
10/10/2019     Endocrinology follow-up note     Subjective:    Patient ID: Natasha Hinton, female    DOB: 10/19/1950.  Natasha Hinton is being seen in follow up for management of currently uncontrolled symptomatic type 2 diabetes, hypothyroidism, hyperlipidemia, hypertension. PCP:   [name], PA-C.   Past Medical History:  Diagnosis Date  . Diabetes mellitus, type II ([place])   . Hyperlipidemia   . Hypertension    History reviewed. No pertinent surgical history. Social History   Socioeconomic History  . Marital status: Married    Spouse name: Not on file  . Number of children: Not on file  . Years of education: Not on file  . Highest education level: Not on file  Occupational History  . Not on file  Tobacco Use  . Smoking status: Never Smoker  . Smokeless tobacco: Never Used  Substance and Sexual Activity  . Alcohol use: Not Currently  . Drug use: Never  . Sexual activity: Not on file  Other Topics Concern  . Not on file  Social History Narrative  . Not on file   Social Determinants of Health   Financial Resource Strain:   . Difficulty of Paying Living Expenses: Not on file  Food Insecurity:   . Worried About Charity fundraiser in the Last Year: Not on file  . Ran Out of Food in the Last Year: Not on file  Transportation Needs:   . Lack of Transportation (Medical): Not on file  . Lack of Transportation (Non-Medical): Not on file  Physical Activity:   . Days of Exercise per Week: Not on file  . Minutes of Exercise per Session: Not on file  Stress:   . Feeling of Stress : Not on file  Social Connections:   . Frequency of Communication with Friends and Family: Not on file  . Frequency of Social Gatherings with Friends and Family: Not on file  . Attends Religious Services: Not on file  . Active Member of Clubs or Organizations: Not on file  . Attends Archivist  Meetings: Not on file  . Marital Status: Not on file   Outpatient Encounter Medications as of 10/10/2019  Medication Sig  . ACCU-CHEK GUIDE test strip USE TO TEST BLOOD SUGAR TWICE DAILY  . amLODipine (NORVASC) 2.5 MG tablet Take 2.5 mg by mouth daily.  . B-D ULTRAFINE III SHORT PEN 31G X 8 MM MISC USE AS DIRECTED AT BEDTIME  . blood glucose meter kit and supplies KIT Dispense based on patient and insurance preference. Use up to four times daily as directed. (FOR ICD-10 E11.65)  . Blood Glucose Monitoring Suppl (ACCU-CHEK GUIDE) w/Device KIT 1 Piece by Does not apply route as directed.  . cetirizine (ZYRTEC) 10 MG tablet daily.  . clotrimazole-betamethasone (LOTRISONE) cream daily.  [name] EPINEPHrine 0.3 mg/0.3 mL IJ SOAJ injection epinephrine 0.3 mg/0.3 mL injection, auto-injector  . Ferrous Sulfate (IRON) 325 (65 Fe) MG TABS daily.  . furosemide (LASIX) 20 MG tablet daily.  [name] glipiZIDE (GLUCOTROL XL) 5 MG 24 hr tablet Take 1 tablet (5 mg total) by mouth daily with breakfast.  .  HYDROcodone-acetaminophen (NORCO/VICODIN) 5-325 MG tablet 2 (two) times daily as needed.  . Insulin Glargine (LANTUS SOLOSTAR) 100 UNIT/ML Solostar Pen Inject 40 Units into the skin at bedtime.  [name] levothyroxine (SYNTHROID) 88 MCG tablet TAKE 1 TABLET(88 MCG) BY MOUTH DAILY BEFORE BREAKFAST  . metFORMIN (GLUCOPHAGE-XR) 500 MG 24 hr tablet Take 500 mg by mouth 2 (two) times daily with a meal.  . metoprolol succinate (TOPROL-XL) 25 MG 24 hr tablet daily.  [name] omeprazole (PRILOSEC) 40 MG capsule daily.  . valsartan-hydrochlorothiazide (DIOVAN-HCT) 320-25 MG tablet TK 1 T PO QD  . [DISCONTINUED] Insulin Glargine (LANTUS SOLOSTAR) 100 UNIT/ML Solostar Pen Inject 30 Units into the skin at bedtime.   No facility-administered encounter medications on file as of 10/10/2019.    ALLERGIES: No Known Allergies  VACCINATION STATUS:  There is no immunization history on file for this patient.  Diabetes She presents for her  follow-up diabetic visit. She has type 2 diabetes mellitus. Onset time: she was diagnosed at approximate age of 79 years. Her disease course has been worsening. Pertinent negatives for hypoglycemia include no confusion, headaches, nervousness/anxiousness, pallor, seizures, sweats or tremors. Associated symptoms include fatigue, polydipsia and polyuria. Pertinent negatives for diabetes include no chest pain and no polyphagia. There are no hypoglycemic complications. Symptoms are worsening. Risk factors for coronary artery disease include dyslipidemia, diabetes mellitus, obesity, hypertension, sedentary lifestyle and post-menopausal. Current diabetic treatment includes oral agent (dual therapy). She is following a generally unhealthy diet. When asked about meal planning, she reported none. She has not had a previous visit with a dietitian. She rarely participates in exercise. Her home blood glucose trend is decreasing steadily. Her breakfast blood glucose range is generally 140-180 mg/dl. Her bedtime blood glucose range is generally >200 mg/dl. Her overall blood glucose range is 180-200 mg/dl. An ACE inhibitor/angiotensin II receptor blocker is being taken. She does not see a podiatrist.Eye exam is current.  Hyperlipidemia This is a chronic problem. The current episode started more than 1 year ago. Exacerbating diseases include diabetes, hypothyroidism and obesity. Pertinent negatives include no chest pain, myalgias or shortness of breath. Risk factors for coronary artery disease include diabetes mellitus, dyslipidemia, hypertension, a sedentary lifestyle, post-menopausal, obesity and family history.  Hypertension This is a chronic problem. The current episode started more than 1 year ago. Pertinent negatives include no chest pain, headaches, palpitations, shortness of breath or sweats. Risk factors for coronary artery disease include dyslipidemia, diabetes mellitus, obesity, sedentary lifestyle, family history  and post-menopausal state. Past treatments include angiotensin blockers and beta blockers.    Review of Systems  Constitutional: Positive for fatigue. Negative for chills, fever and unexpected weight change.  HENT: Negative for trouble swallowing and voice change.   Eyes: Negative for visual disturbance.  Respiratory: Negative for cough, shortness of breath and wheezing.   Cardiovascular: Negative for chest pain, palpitations and leg swelling.  Gastrointestinal: Negative for diarrhea, nausea and vomiting.  Endocrine: Positive for polydipsia and polyuria. Negative for cold intolerance, heat intolerance and polyphagia.  Musculoskeletal: Negative for arthralgias and myalgias.  Skin: Negative for color change, pallor, rash and wound.  Neurological: Negative for tremors, seizures and headaches.  Psychiatric/Behavioral: Negative for confusion and suicidal ideas. The patient is not nervous/anxious.     Objective:    BP (!) 183/92   Pulse 73   Ht '5\' 6"'  (1.676 m)   Wt 296 lb (134.3 kg)   BMI 47.78 kg/m   Wt Readings from Last 3 Encounters:  10/10/19 296 lb (134.3  kg)  03/02/19 295 lb (133.8 kg)  11/30/18 280 lb (127 kg)     Physical Exam- Limited  Constitutional:  Body mass index is 47.78 kg/m. , not in acute distress, normal state of mind Eyes:  EOMI, no exophthalmos Neck: Supple Respiratory: Adequate breathing efforts Musculoskeletal: no gross deformities, strength intact in all four extremities, no gross restriction of joint movements Skin:  no rashes, no hyperemia Neurological: no tremor with outstretched hands.     Recent Results (from the past 2160 hour(s))  HgB A1c     Status: Abnormal   Collection Time: 10/10/19  1:14 PM  Result Value Ref Range   Hemoglobin A1C 9.3 (A) 4.0 - 5.6 %   HbA1c POC (<> result, manual entry)     HbA1c, POC (prediabetic range)     HbA1c, POC (controlled diabetic range)     Lipid Panel     Component Value Date/Time   CHOL 266 (A)  02/25/2019 0000   TRIG 106 02/25/2019 0000   HDL 77 (A) 02/25/2019 0000   LDLCALC 168 02/25/2019 0000    Assessment & Plan:   1. Uncontrolled type 2 diabetes mellitus with hyperglycemia ([place])  - [place] has currently uncontrolled symptomatic type 2 DM since 69 years of age. -She returns with significantly above target glycemic profile both fasting and postprandial.  She did not have any further hypoglycemia after her last phone visit .    -her diabetes is complicated by obesity/sedentary life and she remains at a high risk for more acute and chronic complications which include CAD, CVA, CKD, retinopathy, and neuropathy. These are all discussed in detail with her.  - I have counseled her on diet management and weight loss, by adopting a carbohydrate restricted/protein rich diet.  - she  admits there is a room for improvement in her diet and drink choices. -  Suggestion is made for her to avoid simple carbohydrates  from her diet including Cakes, Sweet Desserts / Pastries, Ice Cream, Soda (diet and regular), Sweet Tea, Candies, Chips, Cookies, Sweet Pastries,  Store Bought Juices, Alcohol in Excess of  1-2 drinks a day, Artificial Sweeteners, Coffee Creamer, and "Sugar-free" Products. This will help patient to have stable blood glucose profile and potentially avoid unintended weight gain.   - I encouraged her to switch to  unprocessed or minimally processed complex starch and increased protein intake (animal or plant source), fruits, and vegetables.  - she is advised to stick to a routine mealtimes to eat 3 meals  a day and avoid unnecessary snacks ( to snack only to correct hypoglycemia).   - I have approached her with the following individualized plan to manage diabetes and patient agrees:   -She has lost control of her diabetes after her glipizide was stopped. -She is advised to increase her Lantus to 40 units nightly,  associated with monitoring of blood glucose 2 times a day  before breakfast and at bedtime.   -  She is advised to resume  glipizide 5 mg XL p.o. daily at breakfast, and  continue metformin 500 mg p.o. daily after breakfast and supper.  - she is encouraged to call clinic for blood glucose levels less than 70 or above 200 mg /dl.  - Patient specific target  A1c;  LDL, HDL, Triglycerides, and  Waist Circumference were discussed in detail.  2) BP/HTN: Her blood pressure is not controlled to target.    she is advised to continue her current medications including losartan/HCTZ 320/25 mg  p.o. daily with breakfast .  3) Lipids/HPL: Her recent lipid panel showed LDL at 171.  She reports better consistency taking her Livalo this time.  She is advised to continue Livalo 2 mg p.o. nightly.     4)  Weight/Diet: Her BMI is 47- clearly complicating her diabetes care.  I discussed with her the fact that loss of 5 - 10% of her  current body weight will have the most impact on her diabetes management.  CDE Consult will be initiated . Exercise, and detailed carbohydrates information provided  -  detailed on discharge instructions.  5) hypothyroidism:  She is advised to continue levothyroxine 88 mcg p.o. daily before breakfast.  - We discussed about the correct intake of her thyroid hormone, on empty stomach at fasting, with water, separated by at least 30 minutes from breakfast and other medications,  and separated by more than 4 hours from calcium, iron, multivitamins, acid reflux medications (PPIs). -Patient is made aware of the fact that thyroid hormone replacement is needed for life, dose to be adjusted by periodic monitoring of thyroid function tests.   6) Chronic Care/Health Maintenance:  -she  is on ACEI/ARB and Statin medications and  is encouraged to initiate and continue to follow up with Ophthalmology, Dentist,  Podiatrist at least yearly or according to recommendations, and advised to  stay away from smoking. I have recommended yearly flu vaccine and  pneumonia vaccine at least every 5 years; moderate intensity exercise for up to 150 minutes weekly; and  sleep for at least 7 hours a day.  - I advised patient to maintain close follow up with [name], PA-C for primary care needs.   - Time spent on this patient care encounter:  35 min, of which > 50% was spent in  counseling and the rest reviewing her blood glucose logs , discussing her hypoglycemia and hyperglycemia episodes, reviewing her current and  previous labs / studies  ( including abstraction from other facilities) and medications  doses and developing a  long term treatment plan and documenting her care.   Please refer to Patient Instructions for Blood Glucose Monitoring and Insulin/Medications Dosing Guide"  in media tab for additional information. Please  also refer to " Patient Self Inventory" in the Media  tab for reviewed elements of pertinent patient history.  [place] participated in the discussions, expressed understanding, and voiced agreement with the above plans.  All questions were answered to her satisfaction. she is encouraged to contact clinic should she have any questions or concerns prior to her return visit.   Follow up plan: - Return in about 3 months (around 01/08/2020) for Follow up with Pre-visit Labs, Next Visit A1c in Office.  Glade Lloyd, MD [facility] [address] [place], [place] [address] Phone: [phone]  Fax: [phone]    10/10/2019, 1:28 PM  This note was partially dictated with voice recognition software. Similar sounding words can be transcribed inadequately or may not  be corrected upon review.

## 2019-11-27 ENCOUNTER — Other Ambulatory Visit: Payer: Self-pay

## 2019-11-27 MED ORDER — LANTUS SOLOSTAR 100 UNIT/ML ~~LOC~~ SOPN: 40.0000 [IU] | PEN_INJECTOR | Freq: Every day | SUBCUTANEOUS | 1 refills | Status: DC

## 2020-01-01 LAB — BASIC METABOLIC PANEL
BUN: 16 (ref 4–21)
Creatinine: 0.9 (ref <=1.1)

## 2020-01-01 LAB — VITAMIN D 25 HYDROXY (VIT D DEFICIENCY, FRACTURES): Vit D, 25-Hydroxy: 23

## 2020-01-01 LAB — TSH: TSH: 0.57 (ref <=5.90)

## 2020-01-09 ENCOUNTER — Encounter: Payer: Self-pay | Admitting: "Endocrinology

## 2020-01-09 ENCOUNTER — Ambulatory Visit (INDEPENDENT_AMBULATORY_CARE_PROVIDER_SITE_OTHER): Payer: Medicare Other | Admitting: "Endocrinology

## 2020-01-09 ENCOUNTER — Other Ambulatory Visit: Payer: Self-pay

## 2020-01-09 VITALS — BP 138/78 | HR 67 | Ht 66.0 in | Wt 288.4 lb

## 2020-01-09 DIAGNOSIS — I10 Essential (primary) hypertension: Secondary | ICD-10-CM

## 2020-01-09 DIAGNOSIS — E1165 Type 2 diabetes mellitus with hyperglycemia: Secondary | ICD-10-CM

## 2020-01-09 DIAGNOSIS — E782 Mixed hyperlipidemia: Secondary | ICD-10-CM

## 2020-01-09 DIAGNOSIS — E039 Hypothyroidism, unspecified: Secondary | ICD-10-CM

## 2020-01-09 DIAGNOSIS — E559 Vitamin D deficiency, unspecified: Secondary | ICD-10-CM | POA: Insufficient documentation

## 2020-01-09 LAB — POCT GLYCOSYLATED HEMOGLOBIN (HGB A1C): Hemoglobin A1C: 8.4 % — AB (ref 4.0–5.6)

## 2020-01-09 NOTE — Patient Instructions (Signed)

## 2020-01-09 NOTE — Progress Notes (Signed)
01/09/2020     Endocrinology follow-up note     Subjective:    Patient ID: Natasha Hinton, female    DOB: [birth year].  [place] is being seen in follow up for management of currently uncontrolled symptomatic type 2 diabetes, hypothyroidism, hyperlipidemia, hypertension. PCP:   [name], PA-C.   Past Medical History:  Diagnosis Date  . Diabetes mellitus, type II ([facility])   . Hyperlipidemia   . Hypertension    History reviewed. No pertinent surgical history. Social History   Socioeconomic History  . Marital status: Married    Spouse name: Not on file  . Number of children: Not on file  . Years of education: Not on file  . Highest education level: Not on file  Occupational History  . Not on file  Tobacco Use  . Smoking status: Never Smoker  . Smokeless tobacco: Never Used  Substance and Sexual Activity  . Alcohol use: Not Currently  . Drug use: Never  . Sexual activity: Not on file  Other Topics Concern  . Not on file  Social History Narrative  . Not on file   Social Determinants of Health   Financial Resource Strain:   . Difficulty of Paying Living Expenses:   Food Insecurity:   . Worried About Charity fundraiser in the Last Year:   . Arboriculturist in the Last Year:   Transportation Needs:   . Film/video editor (Medical):   [name] Lack of Transportation (Non-Medical):   Physical Activity:   . Days of Exercise per Week:   . Minutes of Exercise per Session:   Stress:   . Feeling of Stress :   Social Connections:   . Frequency of Communication with Friends and Family:   . Frequency of Social Gatherings with Friends and Family:   . Attends Religious Services:   . Active Member of Clubs or Organizations:   . Attends Archivist Meetings:   [name] Marital Status:    Outpatient Encounter Medications as of 01/09/2020  Medication Sig  . ACCU-CHEK GUIDE test strip  USE TO TEST BLOOD SUGAR TWICE DAILY  . amLODipine (NORVASC) 2.5 MG tablet Take 2.5 mg by mouth daily.  . B-D ULTRAFINE III SHORT PEN 31G X 8 MM MISC USE AS DIRECTED AT BEDTIME  . blood glucose meter kit and supplies KIT Dispense based on patient and insurance preference. Use up to four times daily as directed. (FOR ICD-10 E11.65)  . Blood Glucose Monitoring Suppl (ACCU-CHEK GUIDE) w/Device KIT 1 Piece by Does not apply route as directed.  . cetirizine (ZYRTEC) 10 MG tablet daily.  . clotrimazole-betamethasone (LOTRISONE) cream daily.  [name] EPINEPHrine 0.3 mg/0.3 mL IJ SOAJ injection epinephrine 0.3 mg/0.3 mL injection, auto-injector  . Ferrous Sulfate (IRON) 325 (65 Fe) MG TABS daily.  . furosemide (LASIX) 20 MG tablet daily.  [name] glipiZIDE (GLUCOTROL XL) 5 MG 24 hr tablet Take 1 tablet (5 mg total) by mouth daily with breakfast.  . HYDROcodone-acetaminophen (NORCO/VICODIN) 5-325 MG tablet 2 (two) times daily as needed.  . Insulin Glargine (LANTUS SOLOSTAR) 100 UNIT/ML Solostar Pen Inject 40 Units into the skin at  bedtime.  [name] levothyroxine (SYNTHROID) 88 MCG tablet TAKE 1 TABLET(88 MCG) BY MOUTH DAILY BEFORE BREAKFAST  . metFORMIN (GLUCOPHAGE-XR) 500 MG 24 hr tablet Take 500 mg by mouth 2 (two) times daily with a meal.  . metoprolol succinate (TOPROL-XL) 25 MG 24 hr tablet daily.  . valsartan-hydrochlorothiazide (DIOVAN-HCT) 320-25 MG tablet TK 1 T PO QD  . [DISCONTINUED] omeprazole (PRILOSEC) 40 MG capsule daily.   No facility-administered encounter medications on file as of 01/09/2020.    ALLERGIES: Allergies  Allergen Reactions  . Lortab [Hydrocodone-Acetaminophen] Itching  . 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  Diabetes She presents for her follow-up diabetic visit. She has type 2 diabetes mellitus. Onset time: she was diagnosed at approximate age of 38 years. Her disease course has been improving. Pertinent negatives for  hypoglycemia include no confusion, headaches, nervousness/anxiousness, pallor, seizures, sweats or tremors. Associated symptoms include fatigue. Pertinent negatives for diabetes include no chest pain, no polydipsia, no polyphagia and no polyuria. There are no hypoglycemic complications. Symptoms are improving. Risk factors for coronary artery disease include dyslipidemia, diabetes mellitus, obesity, hypertension, sedentary lifestyle and post-menopausal. Current diabetic treatment includes oral agent (dual therapy). Her weight is fluctuating minimally. She is following a generally unhealthy diet. When asked about meal planning, she reported none. She has not had a previous visit with a dietitian. She rarely participates in exercise. Her home blood glucose trend is decreasing steadily. Her breakfast blood glucose range is generally 110-130 mg/dl. Her bedtime blood glucose range is generally 180-200 mg/dl. Her overall blood glucose range is 180-200 mg/dl. (Most recently, she documented target fasting and postprandial glycemic profile, no major hypoglycemia.  Her point-of-care A1c is 8.4%, improving from 9.3%.) An ACE inhibitor/angiotensin II receptor blocker is being taken. She does not see a podiatrist.Eye exam is current.  Hyperlipidemia This is a chronic problem. The current episode started more than 1 year ago. Exacerbating diseases include diabetes, hypothyroidism and obesity. Pertinent negatives include no chest pain, myalgias or shortness of breath. Risk factors for coronary artery disease include diabetes mellitus, dyslipidemia, hypertension, a sedentary lifestyle, post-menopausal, obesity and family history.  Hypertension This is a chronic problem. The current episode started more than 1 year ago. Pertinent negatives include no chest pain, headaches, palpitations, shortness of breath or sweats. Risk factors for coronary artery disease include dyslipidemia, diabetes mellitus, obesity, sedentary lifestyle,  family history and post-menopausal state. Past treatments include angiotensin blockers and beta blockers.    Review of Systems  Constitutional: Positive for fatigue. Negative for chills, fever and unexpected weight change.  HENT: Negative for trouble swallowing and voice change.   Eyes: Negative for visual disturbance.  Respiratory: Negative for cough, shortness of breath and wheezing.   Cardiovascular: Negative for chest pain, palpitations and leg swelling.  Gastrointestinal: Negative for diarrhea, nausea and vomiting.  Endocrine: Negative for cold intolerance, heat intolerance, polydipsia, polyphagia and polyuria.  Musculoskeletal: Negative for arthralgias and myalgias.  Skin: Negative for color change, pallor, rash and wound.  Neurological: Negative for tremors, seizures and headaches.  Psychiatric/Behavioral: Negative for confusion and suicidal ideas. The patient is not nervous/anxious.     Objective:    BP 138/78   Pulse 67   Ht '5\' 6"'$  (1.676 m)   Wt 288 lb 6.4 oz (130.8 kg)   BMI 46.55 kg/m   Wt Readings from Last 3 Encounters:  01/09/20 288 lb 6.4 oz (130.8 kg)  10/10/19 296 lb (  134.3 kg)  03/02/19 295 lb (133.8 kg)     Physical Exam- Limited  Constitutional:  Body mass index is 46.55 kg/m. , not in acute distress, normal state of mind Eyes:  EOMI, no exophthalmos Neck: Supple Respiratory: Adequate breathing efforts Musculoskeletal: no gross deformities, strength intact in all four extremities, no gross restriction of joint movements Skin:  no rashes, no hyperemia Neurological: no tremor with outstretched hands.     Recent Results (from the past 2160 hour(s))  VITAMIN D 25 Hydroxy (Vit-D Deficiency, Fractures)     Status: None   Collection Time: 01/01/20 12:00 AM  Result Value Ref Range   Vit D, 25-Hydroxy 23   Basic metabolic panel     Status: None   Collection Time: 01/01/20 12:00 AM  Result Value Ref Range   BUN 16 4 - 21   Creatinine 0.9 0.5 - 1.1  TSH      Status: None   Collection Time: 01/01/20 12:00 AM  Result Value Ref Range   TSH 0.57 0.41 - 5.90    Comment: Free T4 1.41  HgB A1c     Status: Abnormal   Collection Time: 01/09/20 11:20 AM  Result Value Ref Range   Hemoglobin A1C 8.4 (A) 4.0 - 5.6 %   HbA1c POC (<> result, manual entry)     HbA1c, POC (prediabetic range)     HbA1c, POC (controlled diabetic range)     Lipid Panel     Component Value Date/Time   CHOL 266 (A) 02/25/2019 0000   TRIG 106 02/25/2019 0000   HDL 77 (A) 02/25/2019 0000   LDLCALC 168 02/25/2019 0000    Assessment & Plan:   1. Uncontrolled type 2 diabetes mellitus with hyperglycemia ([place])  - [place] has currently uncontrolled symptomatic type 2 DM since 69 years of age. -She returns with significantly improved glycemic profile both fasting and postprandial.  She did not have any further hypoglycemia after her last phone visit .    -her diabetes is complicated by obesity/sedentary life and she remains at a high risk for more acute and chronic complications which include CAD, CVA, CKD, retinopathy, and neuropathy. These are all discussed in detail with her.  - I have counseled her on diet management and weight loss, by adopting a carbohydrate restricted/protein rich diet.  - she  admits there is a room for improvement in her diet and drink choices. -  Suggestion is made for her to avoid simple carbohydrates  from her diet including Cakes, Sweet Desserts / Pastries, Ice Cream, Soda (diet and regular), Sweet Tea, Candies, Chips, Cookies, Sweet Pastries,  Store Bought Juices, Alcohol in Excess of  1-2 drinks a day, Artificial Sweeteners, Coffee Creamer, and "Sugar-free" Products. This will help patient to have stable blood glucose profile and potentially avoid unintended weight gain.   - I encouraged her to switch to  unprocessed or minimally processed complex starch and increased protein intake (animal or plant source), fruits, and vegetables.  -  she is advised to stick to a routine mealtimes to eat 3 meals  a day and avoid unnecessary snacks ( to snack only to correct hypoglycemia).   - I have approached her with the following individualized plan to manage diabetes and patient agrees:    -She is advised to continue Lantus 40 units nightly,   associated with monitoring of blood glucose 2 times a day before breakfast and at bedtime.   -  She is advised to continue glipizide 5  mg XL p.o. daily at breakfast, and continue Metformin 500 mg p.o. twice daily after breakfast and after supper.    - she is encouraged to call clinic for blood glucose levels less than 70 or above 200 mg /dl.  - Patient specific target  A1c;  LDL, HDL, Triglycerides,  were discussed in detail.  2) BP/HTN:  Her blood pressure is controlled to target.    she is advised to continue her current medications including losartan/HCTZ 320/25 mg p.o. daily with breakfast .  3) Lipids/HPL: Her recent lipid panel showed LDL at 171.  She reports better consistency taking her Livalo this time.  She is advised to continue Livalo 2 mg p.o. nightly.     4)  Weight/Diet: Her BMI is 47- clearly complicating her diabetes care.  She is a candidate for modest weight loss.  I discussed with her the fact that loss of 5 - 10% of her  current body weight will have the most impact on her diabetes management.  CDE Consult will be initiated . Exercise, and detailed carbohydrates information provided  -  detailed on discharge instructions.  5) hypothyroidism:  She is advised to continue levothyroxine 88 mcg p.o. daily before breakfast.   - We discussed about the correct intake of her thyroid hormone, on empty stomach at fasting, with water, separated by at least 30 minutes from breakfast and other medications,  and separated by more than 4 hours from calcium, iron, multivitamins, acid reflux medications (PPIs). -Patient is made aware of the fact that thyroid hormone replacement is needed for  life, dose to be adjusted by periodic monitoring of thyroid function tests.   6) Chronic Care/Health Maintenance:  -she  is on ACEI/ARB and Statin medications and  is encouraged to initiate and continue to follow up with Ophthalmology, Dentist,  Podiatrist at least yearly or according to recommendations, and advised to  stay away from smoking. I have recommended yearly flu vaccine and pneumonia vaccine at least every 5 years; moderate intensity exercise for up to 150 minutes weekly; and  sleep for at least 7 hours a day.  - I advised patient to maintain close follow up with [name], PA-C for primary care needs.   - Time spent on this patient care encounter:  35 min, of which > 50% was spent in  counseling and the rest reviewing her blood glucose logs , discussing her hypoglycemia and hyperglycemia episodes, reviewing her current and  previous labs / studies  ( including abstraction from other facilities) and medications  doses and developing a  long term treatment plan and documenting her care.   Please refer to Patient Instructions for Blood Glucose Monitoring and Insulin/Medications Dosing Guide"  in media tab for additional information. Please  also refer to " Patient Self Inventory" in the Media  tab for reviewed elements of pertinent patient history.  [place] participated in the discussions, expressed understanding, and voiced agreement with the above plans.  All questions were answered to her satisfaction. she is encouraged to contact clinic should she have any questions or concerns prior to her return visit.    Follow up plan: - Return in about 3 months (around 04/09/2020) for Bring Meter and Logs- A1c in Office.  Glade Lloyd, MD [facility] [address] [facility] [address] [place], [place] [address] Phone: [phone]  Fax: [phone]    01/09/2020, 12:53 PM  This note was partially dictated with voice recognition software.  Similar sounding words  can be transcribed inadequately or may not  be corrected upon review.

## 2020-02-01 ENCOUNTER — Other Ambulatory Visit: Payer: Self-pay | Admitting: "Endocrinology

## 2020-03-28 ENCOUNTER — Other Ambulatory Visit: Payer: Self-pay | Admitting: "Endocrinology

## 2020-04-15 ENCOUNTER — Ambulatory Visit (INDEPENDENT_AMBULATORY_CARE_PROVIDER_SITE_OTHER): Payer: Medicare Other | Admitting: "Endocrinology

## 2020-04-15 ENCOUNTER — Other Ambulatory Visit: Payer: Self-pay

## 2020-04-15 ENCOUNTER — Encounter: Payer: Self-pay | Admitting: "Endocrinology

## 2020-04-15 VITALS — BP 136/76 | HR 81 | Ht 66.0 in | Wt 293.8 lb

## 2020-04-15 DIAGNOSIS — I10 Essential (primary) hypertension: Secondary | ICD-10-CM | POA: Diagnosis not present

## 2020-04-15 DIAGNOSIS — E559 Vitamin D deficiency, unspecified: Secondary | ICD-10-CM

## 2020-04-15 DIAGNOSIS — E782 Mixed hyperlipidemia: Secondary | ICD-10-CM | POA: Diagnosis not present

## 2020-04-15 DIAGNOSIS — E1165 Type 2 diabetes mellitus with hyperglycemia: Secondary | ICD-10-CM

## 2020-04-15 DIAGNOSIS — E039 Hypothyroidism, unspecified: Secondary | ICD-10-CM

## 2020-04-15 LAB — POCT GLYCOSYLATED HEMOGLOBIN (HGB A1C): Hemoglobin A1C: 8.2 % — AB (ref 4.0–5.6)

## 2020-04-15 MED ORDER — GLIPIZIDE 5 MG PO TABS: 5.0000 mg | ORAL_TABLET | Freq: Two times a day (BID) | ORAL | 1 refills | Status: DC

## 2020-04-15 MED ORDER — LANTUS SOLOSTAR 100 UNIT/ML ~~LOC~~ SOPN: 40.0000 [IU] | PEN_INJECTOR | Freq: Every day | SUBCUTANEOUS | 2 refills | Status: DC

## 2020-04-15 NOTE — Progress Notes (Signed)
04/15/2020     Endocrinology follow-up note     Subjective:    Patient ID: Natasha Hinton, female    DOB: [birth year].  Natasha Hinton is being seen in follow up for management of currently uncontrolled symptomatic type 2 diabetes, hypothyroidism, hyperlipidemia, hypertension. PCP:   [name], PA-C.   Past Medical History:  Diagnosis Date  . Diabetes mellitus, type II ([place])   . Hyperlipidemia   . Hypertension    History reviewed. No pertinent surgical history. Social History   Socioeconomic History  . Marital status: Married    Spouse name: Not on file  . Number of children: Not on file  . Years of education: Not on file  . Highest education level: Not on file  Occupational History  . Not on file  Tobacco Use  . Smoking status: Never Smoker  . Smokeless tobacco: Never Used  Vaping Use  . Vaping Use: Never used  Substance and Sexual Activity  . Alcohol use: Not Currently  . Drug use: Never  . Sexual activity: Not on file  Other Topics Concern  . Not on file  Social History Narrative  . Not on file   Social Determinants of Health   Financial Resource Strain:   . Difficulty of Paying Living Expenses:   Food Insecurity:   . Worried About Charity fundraiser in the Last Year:   . Arboriculturist in the Last Year:   Transportation Needs:   . Film/video editor (Medical):   [name] Lack of Transportation (Non-Medical):   Physical Activity:   . Days of Exercise per Week:   . Minutes of Exercise per Session:   Stress:   . Feeling of Stress :   Social Connections:   . Frequency of Communication with Friends and Family:   . Frequency of Social Gatherings with Friends and Family:   . Attends Religious Services:   . Active Member of Clubs or Organizations:   . Attends Archivist Meetings:   [name] Marital Status:    Outpatient Encounter Medications as of 04/15/2020   Medication Sig  . ACCU-CHEK GUIDE test strip USE TO TEST BLOOD SUGAR TWICE DAILY  . amLODipine (NORVASC) 2.5 MG tablet Take 2.5 mg by mouth daily.  . B-D ULTRAFINE III SHORT PEN 31G X 8 MM MISC USE AS DIRECTED AT BEDTIME  . blood glucose meter kit and supplies KIT Dispense based on patient and insurance preference. Use up to four times daily as directed. (FOR ICD-10 E11.65)  . Blood Glucose Monitoring Suppl (ACCU-CHEK GUIDE) w/Device KIT 1 Piece by Does not apply route as directed.  . cetirizine (ZYRTEC) 10 MG tablet daily.  . clotrimazole-betamethasone (LOTRISONE) cream daily.  [name] EPINEPHrine 0.3 mg/0.3 mL IJ SOAJ injection epinephrine 0.3 mg/0.3 mL injection, auto-injector  . Ferrous Sulfate (IRON) 325 (65 Fe) MG TABS daily.  . furosemide (LASIX) 20 MG tablet daily.  [name] glipiZIDE (GLUCOTROL) 5 MG tablet Take 1 tablet (5 mg total) by mouth 2 (two) times daily before a meal.  . HYDROcodone-acetaminophen (NORCO/VICODIN) 5-325 MG tablet 2 (two) times daily as needed.  . insulin glargine (LANTUS SOLOSTAR) 100  UNIT/ML Solostar Pen Inject 40 Units into the skin at bedtime.  [name] levothyroxine (SYNTHROID) 88 MCG tablet TAKE 1 TABLET(88 MCG) BY MOUTH DAILY BEFORE BREAKFAST  . metFORMIN (GLUCOPHAGE-XR) 500 MG 24 hr tablet Take 500 mg by mouth 2 (two) times daily with a meal.  . metoprolol succinate (TOPROL-XL) 25 MG 24 hr tablet daily.  . valsartan-hydrochlorothiazide (DIOVAN-HCT) 320-25 MG tablet TK 1 T PO QD  . [DISCONTINUED] glipiZIDE (GLUCOTROL XL) 5 MG 24 hr tablet TAKE 1 TABLET(5 MG) BY MOUTH DAILY WITH BREAKFAST  . [DISCONTINUED] Insulin Glargine (LANTUS SOLOSTAR) 100 UNIT/ML Solostar Pen Inject 40 Units into the skin at bedtime.   No facility-administered encounter medications on file as of 04/15/2020.    ALLERGIES: Allergies  Allergen Reactions  . Lortab [Hydrocodone-Acetaminophen] Itching  . 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  Diabetes She presents for her follow-up diabetic visit. She has type 2 diabetes mellitus. Onset time: she was diagnosed at approximate age of 69 years. Her disease course has been stable. Pertinent negatives for hypoglycemia include no confusion, headaches, nervousness/anxiousness, pallor, seizures, sweats or tremors. Associated symptoms include fatigue, polydipsia and polyuria. Pertinent negatives for diabetes include no chest pain and no polyphagia. There are no hypoglycemic complications. Symptoms are stable. Risk factors for coronary artery disease include dyslipidemia, diabetes mellitus, obesity, hypertension, sedentary lifestyle and post-menopausal. Current diabetic treatment includes oral agent (dual therapy). Her weight is fluctuating minimally. She is following a generally unhealthy diet. When asked about meal planning, she reported none. She has not had a previous visit with a dietitian. She rarely participates in exercise. Her home blood glucose trend is fluctuating minimally. Her breakfast blood glucose range is generally 90-110 mg/dl. Her bedtime blood glucose range is generally 180-200 mg/dl. Her overall blood glucose range is 180-200 mg/dl. (Most recently, she documented target fasting and postprandial glycemic profile, no major hypoglycemia.  Her point-of-care A1c is 8.4%, improving from 9.3%.) An ACE inhibitor/angiotensin II receptor blocker is being taken. She does not see a podiatrist.Eye exam is current.  Hyperlipidemia This is a chronic problem. The current episode started more than 1 year ago. Exacerbating diseases include diabetes, hypothyroidism and obesity. Pertinent negatives include no chest pain, myalgias or shortness of breath. Risk factors for coronary artery disease include diabetes mellitus, dyslipidemia, hypertension, a sedentary lifestyle, post-menopausal, obesity and family history.  Hypertension This is a chronic problem. The current episode started more than 1  year ago. Pertinent negatives include no chest pain, headaches, palpitations, shortness of breath or sweats. Risk factors for coronary artery disease include dyslipidemia, diabetes mellitus, obesity, sedentary lifestyle, family history and post-menopausal state. Past treatments include angiotensin blockers and beta blockers.    Review of Systems  Constitutional: Positive for fatigue. Negative for chills, fever and unexpected weight change.  HENT: Negative for trouble swallowing and voice change.   Eyes: Negative for visual disturbance.  Respiratory: Negative for cough, shortness of breath and wheezing.   Cardiovascular: Negative for chest pain, palpitations and leg swelling.  Gastrointestinal: Negative for diarrhea, nausea and vomiting.  Endocrine: Positive for polydipsia and polyuria. Negative for cold intolerance, heat intolerance and polyphagia.  Musculoskeletal: Negative for arthralgias and myalgias.  Skin: Negative for color change, pallor, rash and wound.  Neurological: Negative for tremors, seizures and headaches.  Psychiatric/Behavioral: Negative for confusion and suicidal ideas. The patient is not nervous/anxious.     Objective:    BP 136/76   Pulse  81   Ht '5\' 6"'  (1.676 m)   Wt 293 lb 12.8 oz (133.3 kg)   BMI 47.42 kg/m   Wt Readings from Last 3 Encounters:  04/15/20 293 lb 12.8 oz (133.3 kg)  01/09/20 288 lb 6.4 oz (130.8 kg)  10/10/19 296 lb (134.3 kg)      Physical Exam- Limited  Constitutional:  Body mass index is 47.42 kg/m. , not in acute distress, normal state of mind Eyes:  EOMI, no exophthalmos Neck: Supple Thyroid: No gross goiter Respiratory: Adequate breathing efforts Musculoskeletal: no gross deformities, strength intact in all four extremities, no gross restriction of joint movements Skin:  no rashes, no hyperemia Neurological: no tremor with outstretched hands,      Recent Results (from the past 2160 hour(s))  HgB A1c     Status: Abnormal    Collection Time: 04/15/20 10:20 AM  Result Value Ref Range   Hemoglobin A1C 8.2 (A) 4.0 - 5.6 %   HbA1c POC (<> result, manual entry)     HbA1c, POC (prediabetic range)     HbA1c, POC (controlled diabetic range)     Lipid Panel     Component Value Date/Time   CHOL 266 (A) 02/25/2019 0000   TRIG 106 02/25/2019 0000   HDL 77 (A) 02/25/2019 0000   LDLCALC 168 02/25/2019 0000   CMP Latest Ref Rng & Units 01/01/2020 05/29/2019 02/25/2019  BUN 4 - '21 16 15 14  ' Creatinine 0.5 - 1.1 0.9 0.8 0.8    Assessment & Plan:   1. Uncontrolled type 2 diabetes mellitus with hyperglycemia ([place])  - [place] has currently uncontrolled symptomatic type 2 DM since 69 years of age. -She returns with significantly improved glycemic profile at fasting, however continued hyperglycemia postprandially.    She did not have any further hypoglycemia after her last phone visit .    -her diabetes is complicated by obesity/sedentary life and she remains at a high risk for more acute and chronic complications which include CAD, CVA, CKD, retinopathy, and neuropathy. These are all discussed in detail with her.  - I have counseled her on diet management and weight loss, by adopting a carbohydrate restricted/protein rich diet.  - she  admits there is a room for improvement in her diet and drink choices. -  Suggestion is made for her to avoid simple carbohydrates  from her diet including Cakes, Sweet Desserts / Pastries, Ice Cream, Soda (diet and regular), Sweet Tea, Candies, Chips, Cookies, Sweet Pastries,  Store Bought Juices, Alcohol in Excess of  1-2 drinks a day, Artificial Sweeteners, Coffee Creamer, and "Sugar-free" Products. This will help patient to have stable blood glucose profile and potentially avoid unintended weight gain.  - I encouraged her to switch to  unprocessed or minimally processed complex starch and increased protein intake (animal or plant source), fruits, and vegetables.  - she is advised  to stick to a routine mealtimes to eat 3 meals  a day and avoid unnecessary snacks ( to snack only to correct hypoglycemia).   - I have approached her with the following individualized plan to manage diabetes and patient agrees:    -She will not tolerate any higher dose of basal insulin, advised to continue  Lantus 40 units nightly,   associated with monitoring of blood glucose 2 times a day before breakfast and at bedtime.   -  She will need better postprandial glycemic control.  She is advised to finish her current glipizide 5 mg XL p.o. daily  at breakfast, subsequently will be switched to regular strength glipizide 5 mg p.o. twice daily with breakfast and supper.   -She will continue  Metformin 500 mg p.o. twice daily after breakfast and after supper.    - she is encouraged to call clinic for blood glucose levels less than 70 or above 200 mg /dl.  - Patient specific target  A1c;  LDL, HDL, Triglycerides,  were discussed in detail.  2) BP/HTN:  -Her blood pressure is controlled to target.    she is advised to continue her current medications including losartan/HCTZ 320/25 mg p.o. daily with breakfast .  3) Lipids/HPL: Her recent lipid panel showed LDL at 171.  She reports better consistency taking her Livalo this time.  She is advised to continue Livalo 2 mg p.o. nightly.     4)  Weight/Diet: Her BMI is 47- clearly complicating her diabetes care.  She is a candidate for modest weight loss.  I discussed with her the fact that loss of 5 - 10% of her  current body weight will have the most impact on her diabetes management.  CDE Consult will be initiated . Exercise, and detailed carbohydrates information provided  -  detailed on discharge instructions.  5) hypothyroidism:  She is advised to continue levothyroxine 88 mcg p.o. daily before breakfast.   - We discussed about the correct intake of her thyroid hormone, on empty stomach at fasting, with water, separated by at least 30 minutes from  breakfast and other medications,  and separated by more than 4 hours from calcium, iron, multivitamins, acid reflux medications (PPIs). -Patient is made aware of the fact that thyroid hormone replacement is needed for life, dose to be adjusted by periodic monitoring of thyroid function tests.    6) Chronic Care/Health Maintenance:  -she  is on ACEI/ARB and Statin medications and  is encouraged to initiate and continue to follow up with Ophthalmology, Dentist,  Podiatrist at least yearly or according to recommendations, and advised to  stay away from smoking. I have recommended yearly flu vaccine and pneumonia vaccine at least every 5 years; moderate intensity exercise for up to 150 minutes weekly; and  sleep for at least 7 hours a day.  - I advised patient to maintain close follow up with [name], PA-C for primary care needs.   - Time spent on this patient care encounter:  35 min, of which > 50% was spent in  counseling and the rest reviewing her blood glucose logs , discussing her hypoglycemia and hyperglycemia episodes, reviewing her current and  previous labs / studies  ( including abstraction from other facilities) and medications  doses and developing a  long term treatment plan and documenting her care.   Please refer to Patient Instructions for Blood Glucose Monitoring and Insulin/Medications Dosing Guide"  in media tab for additional information. Please  also refer to " Patient Self Inventory" in the Media  tab for reviewed elements of pertinent patient history.  [place] participated in the discussions, expressed understanding, and voiced agreement with the above plans.  All questions were answered to her satisfaction. she is encouraged to contact clinic should she have any questions or concerns prior to her return visit.   Follow up plan: - Return in about 4 months (around 08/16/2020) for F/U with Pre-visit Labs, Meter, Logs, A1c here.Glade Lloyd, MD [facility] [address] [place], [place] [address] Phone: [phone]  Fax: [phone]  04/15/2020, 12:45 PM  This note was partially dictated with voice recognition software. Similar sounding words can be transcribed inadequately or may not  be corrected upon review.

## 2020-04-15 NOTE — Patient Instructions (Signed)

## 2020-05-02 ENCOUNTER — Other Ambulatory Visit: Payer: Self-pay | Admitting: "Endocrinology

## 2020-06-26 ENCOUNTER — Other Ambulatory Visit: Payer: Self-pay | Admitting: "Endocrinology

## 2020-08-06 ENCOUNTER — Other Ambulatory Visit: Payer: Self-pay | Admitting: "Endocrinology

## 2020-08-13 LAB — TSH: TSH: 2.54 (ref 0.41–5.90)

## 2020-08-13 LAB — BASIC METABOLIC PANEL
BUN: 13 (ref 4–21)
Creatinine: 0.8 (ref 0.5–1.1)

## 2020-08-13 LAB — COMPREHENSIVE METABOLIC PANEL
Calcium: 8.7 (ref 8.7–10.7)
GFR calc Af Amer: 87
GFR calc non Af Amer: 75

## 2020-08-19 ENCOUNTER — Ambulatory Visit (INDEPENDENT_AMBULATORY_CARE_PROVIDER_SITE_OTHER): Payer: Medicare Other | Admitting: Nurse Practitioner

## 2020-08-19 ENCOUNTER — Other Ambulatory Visit: Payer: Self-pay

## 2020-08-19 ENCOUNTER — Encounter: Payer: Self-pay | Admitting: Nurse Practitioner

## 2020-08-19 VITALS — BP 150/81 | HR 72 | Ht 66.0 in | Wt 292.0 lb

## 2020-08-19 DIAGNOSIS — I10 Essential (primary) hypertension: Secondary | ICD-10-CM | POA: Diagnosis not present

## 2020-08-19 DIAGNOSIS — E559 Vitamin D deficiency, unspecified: Secondary | ICD-10-CM

## 2020-08-19 DIAGNOSIS — E039 Hypothyroidism, unspecified: Secondary | ICD-10-CM

## 2020-08-19 DIAGNOSIS — E782 Mixed hyperlipidemia: Secondary | ICD-10-CM

## 2020-08-19 DIAGNOSIS — E1165 Type 2 diabetes mellitus with hyperglycemia: Secondary | ICD-10-CM

## 2020-08-19 LAB — POCT GLYCOSYLATED HEMOGLOBIN (HGB A1C): Hemoglobin A1C: 8.3 % — AB (ref 4.0–5.6)

## 2020-08-19 NOTE — Progress Notes (Signed)
08/19/2020     Endocrinology follow-up note     Subjective:    Patient ID: Natasha Hinton, female    DOB: [birth year].  Natasha Hinton is being seen in follow up for management of currently uncontrolled symptomatic type 2 diabetes, hypothyroidism, hyperlipidemia, hypertension. PCP:   [name], PA-C.   Past Medical History:  Diagnosis Date   Diabetes mellitus, type II ([place])    Hyperlipidemia    Hypertension    History reviewed. No pertinent surgical history. Social History   Socioeconomic History   Marital status: Married    Spouse name: Not on file   Number of children: Not on file   Years of education: Not on file   Highest education level: Not on file  Occupational History   Not on file  Tobacco Use   Smoking status: Never Smoker   Smokeless tobacco: Never Used  Vaping Use   Vaping Use: Never used  Substance and Sexual Activity   Alcohol use: Not Currently   Drug use: Never   Sexual activity: Not on file  Other Topics Concern   Not on file  Social History Narrative   Not on file   Social Determinants of Health   Financial Resource Strain:    Difficulty of Paying Living Expenses: Not on file  Food Insecurity:    Worried About [place] in the Last Year: Not on file   Ran Out of Food in the Last Year: Not on file  Transportation Needs:    Lack of Transportation (Medical): Not on file   Lack of Transportation (Non-Medical): Not on file  Physical Activity:    Days of Exercise per Week: Not on file   Minutes of Exercise per Session: Not on file  Stress:    Feeling of Stress : Not on file  Social Connections:    Frequency of Communication with Friends and Family: Not on file   Frequency of Social Gatherings with Friends and Family: Not on file   Attends Religious Services: Not on file   Active Member of Clubs or Organizations:  Not on file   Attends Archivist Meetings: Not on file   Marital Status: Not on file   Outpatient Encounter Medications as of 08/19/2020  Medication Sig   ACCU-CHEK GUIDE test strip USE TO TEST BLOOD SUGAR TWICE DAILY   amLODipine (NORVASC) 2.5 MG tablet Take 2.5 mg by mouth daily.   B-D ULTRAFINE III SHORT PEN 31G X 8 MM MISC USE AS DIRECTED AT BEDTIME   blood glucose meter kit and supplies KIT Dispense based on patient and insurance preference. Use up to four times daily as directed. (FOR ICD-10 E11.65)   Blood Glucose Monitoring Suppl (ACCU-CHEK GUIDE) w/Device KIT 1 Piece by Does not apply route as directed.   cetirizine (ZYRTEC) 10 MG tablet daily.   clotrimazole-betamethasone (LOTRISONE) cream daily.   EPINEPHrine 0.3 mg/0.3 mL IJ SOAJ injection epinephrine 0.3 mg/0.3 mL injection, auto-injector   famotidine (PEPCID) 20 MG tablet famotidine 20 mg tablet  TAKE 1 TABLET BY MOUTH AT BEDTIME   Ferrous Sulfate (IRON) 325 (65 Fe) MG TABS daily.   furosemide (  LASIX) 20 MG tablet daily.   LANTUS SOLOSTAR 100 UNIT/ML Solostar Pen ADMINISTER 40 UNITS UNDER THE SKIN AT BEDTIME   levothyroxine (SYNTHROID) 88 MCG tablet TAKE 1 TABLET BY MOUTH DAILY BEFORE BREAKFAST   metFORMIN (GLUCOPHAGE-XR) 500 MG 24 hr tablet Take 500 mg by mouth 2 (two) times daily with a meal.   metoprolol tartrate (LOPRESSOR) 50 MG tablet Take 50 mg by mouth 2 (two) times daily.    valsartan-hydrochlorothiazide (DIOVAN-HCT) 320-25 MG tablet TK 1 T PO QD   [DISCONTINUED] glipiZIDE (GLUCOTROL) 5 MG tablet Take 1 tablet (5 mg total) by mouth 2 (two) times daily before a meal.   [DISCONTINUED] HYDROcodone-acetaminophen (NORCO/VICODIN) 5-325 MG tablet 2 (two) times daily as needed.   [DISCONTINUED] metoprolol succinate (TOPROL-XL) 25 MG 24 hr tablet daily.   No facility-administered encounter medications on file as of 08/19/2020.    ALLERGIES: Allergies  Allergen Reactions   Lortab  [Hydrocodone-Acetaminophen] Itching   Meat [Alpha-Gal]     VACCINATION STATUS:  There is no immunization history on file for this patient.  Diabetes She presents for her follow-up diabetic visit. She has type 2 diabetes mellitus. Onset time: she was diagnosed at approximate age of 40 years. Her disease course has been worsening. Hypoglycemia symptoms include nervousness/anxiousness, sweats and tremors. Pertinent negatives for hypoglycemia include no confusion, headaches, pallor or seizures. Pertinent negatives for diabetes include no chest pain, no fatigue, no polydipsia, no polyphagia and no polyuria. There are no hypoglycemic complications. Symptoms are worsening. There are no diabetic complications. Risk factors for coronary artery disease include dyslipidemia, diabetes mellitus, obesity, hypertension, sedentary lifestyle and post-menopausal. Current diabetic treatment includes oral agent (dual therapy) and insulin injections. She is compliant with treatment most of the time. Her weight is fluctuating minimally. She is following a generally healthy diet. When asked about meal planning, she reported none. She has not had a previous visit with a dietitian. She rarely participates in exercise. Her home blood glucose trend is decreasing rapidly. Her breakfast blood glucose range is generally 70-90 mg/dl. (She presents today with her meter and logs showing tight fasting glycemic profile with frequent fasting hypoglycemia.  She reports needing to snack frequently to prevent low glucose readings.  Her POCT A1c today is 8.3%, essentially unchanged from previous visit of 8.2%.) An ACE inhibitor/angiotensin II receptor blocker is being taken. She does not see a podiatrist.Eye exam is current.  Hyperlipidemia This is a chronic problem. The current episode started more than 1 year ago. The problem is uncontrolled. Recent lipid tests were reviewed and are high. Exacerbating diseases include chronic renal disease,  diabetes, hypothyroidism and obesity. Factors aggravating her hyperlipidemia include thiazides. Pertinent negatives include no chest pain, myalgias or shortness of breath. She is currently on no antihyperlipidemic treatment. Compliance problems include adherence to diet and adherence to exercise.  Risk factors for coronary artery disease include diabetes mellitus, dyslipidemia, hypertension, a sedentary lifestyle, post-menopausal, obesity and family history.  Hypertension This is a chronic problem. The current episode started more than 1 year ago. The problem has been gradually improving since onset. The problem is uncontrolled. Associated symptoms include sweats. Pertinent negatives include no chest pain, headaches, palpitations or shortness of breath. Agents associated with hypertension include thyroid hormones. Risk factors for coronary artery disease include dyslipidemia, diabetes mellitus, obesity, sedentary lifestyle, family history and post-menopausal state. Past treatments include angiotensin blockers, calcium channel blockers and diuretics. The current treatment provides mild improvement. There are no compliance problems.  Hypertensive end-organ damage includes kidney  disease. Identifiable causes of hypertension include chronic renal disease and a thyroid problem.    Review of systems  Constitutional: + Minimally fluctuating body weight,  current Body mass index is 47.13 kg/m. , no fatigue, no subjective hyperthermia, no subjective hypothermia Eyes: no blurry vision, no xerophthalmia ENT: no sore throat, no nodules palpated in throat, no dysphagia/odynophagia, no hoarseness Cardiovascular: no chest pain, no shortness of breath, no palpitations, no leg swelling Respiratory: no cough, no shortness of breath Gastrointestinal: no nausea/vomiting/diarrhea Musculoskeletal: no muscle/joint aches Skin: no rashes, no hyperemia Neurological: no tremors, no numbness, no tingling, no  dizziness Psychiatric: no depression, no anxiety  Objective:    BP (!) 150/81 (BP Location: Left Arm, Patient Position: Sitting)    Pulse 72    Ht '5\' 6"'  (1.676 m)    Wt 292 lb (132.5 kg)    BMI 47.13 kg/m   Wt Readings from Last 3 Encounters:  08/19/20 292 lb (132.5 kg)  04/15/20 293 lb 12.8 oz (133.3 kg)  01/09/20 288 lb 6.4 oz (130.8 kg)    BP Readings from Last 3 Encounters:  08/19/20 (!) 150/81  04/15/20 136/76  01/09/20 138/78     Physical Exam- Limited  Constitutional:  Body mass index is 47.13 kg/m. , not in acute distress, normal state of mind Eyes:  EOMI, no exophthalmos Neck: Supple Thyroid: No gross goiter Cardiovascular: RRR, no murmers, rubs, or gallops, no edema Respiratory: Adequate breathing efforts, no crackles, rales, rhonchi, or wheezing Musculoskeletal: no gross deformities, strength intact in all four extremities, no gross restriction of joint movements Skin:  no rashes, no hyperemia Neurological: no tremor with outstretched hands     Recent Results (from the past 2160 hour(s))  Basic metabolic panel     Status: None   Collection Time: 08/13/20 12:00 AM  Result Value Ref Range   BUN 13 4 - 21   Creatinine 0.8 0.5 - 1.1  Comprehensive metabolic panel     Status: None   Collection Time: 08/13/20 12:00 AM  Result Value Ref Range   GFR calc Af Amer 87    GFR calc non Af Amer 75    Calcium 8.7 8.7 - 10.7  TSH     Status: None   Collection Time: 08/13/20 12:00 AM  Result Value Ref Range   TSH 2.54 0.41 - 5.90    Comment: FREE T4- 1.16  HgB A1c     Status: Abnormal   Collection Time: 08/19/20 10:35 AM  Result Value Ref Range   Hemoglobin A1C 8.3 (A) 4.0 - 5.6 %   HbA1c POC (<> result, manual entry)     HbA1c, POC (prediabetic range)     HbA1c, POC (controlled diabetic range)     Lipid Panel     Component Value Date/Time   CHOL 266 (A) 02/25/2019 0000   TRIG 106 02/25/2019 0000   HDL 77 (A) 02/25/2019 0000   LDLCALC 168 02/25/2019 0000    CMP Latest Ref Rng & Units 08/13/2020 01/01/2020 05/29/2019  BUN 4 - '21 13 16 15  ' Creatinine 0.5 - 1.1 0.8 0.9 0.8  Calcium 8.7 - 10.7 8.7 - -    Assessment & Plan:   1) Uncontrolled type 2 diabetes mellitus with hyperglycemia ([facility])  - Natasha Hinton has currently uncontrolled symptomatic type 2 DM since 69 years of age.  She presents today with her meter and logs showing tight fasting glycemic profile with frequent fasting hypoglycemia.  She reports needing to snack frequently to  prevent low glucose readings.  Her POCT A1c today is 8.3%, essentially unchanged from previous visit of 8.2%.    -her diabetes is complicated by obesity/sedentary life and she remains at a high risk for more acute and chronic complications which include CAD, CVA, CKD, retinopathy, and neuropathy. These are all discussed in detail with her.  - Nutritional counseling repeated at each appointment due to patients tendency to fall back in to old habits.  - The patient admits there is a room for improvement in their diet and drink choices. -  Suggestion is made for the patient to avoid simple carbohydrates from their diet including Cakes, Sweet Desserts / Pastries, Ice Cream, Soda (diet and regular), Sweet Tea, Candies, Chips, Cookies, Sweet Pastries,  Store Bought Juices, Alcohol in Excess of  1-2 drinks a day, Artificial Sweeteners, Coffee Creamer, and "Sugar-free" Products. This will help patient to have stable blood glucose profile and potentially avoid unintended weight gain.   - I encouraged the patient to switch to  unprocessed or minimally processed complex starch and increased protein intake (animal or plant source), fruits, and vegetables.   - Patient is advised to stick to a routine mealtimes to eat 3 meals  a day and avoid unnecessary snacks ( to snack only to correct hypoglycemia).  - I have approached her with the following individualized plan to manage diabetes and patient agrees:    -She will not  tolerate any higher dose of basal insulin, advised to continue Lantus 40 units nightly.  She is advised to continue Metformin 500 mg po twice daily with meals.   -Given her frequent fasting hypoglycemia, she is advised to discontinue Glipizide for now.    -She is encouraged to continue monitoring blood glucose twice daily, before breakfast and before bed, and call the clinic if she has readings less than 70 or greater than 300 for 3 tests in a row or fasting for 3 days out of the week.  - Patient specific target  A1c;  LDL, HDL, Triglycerides,  were discussed in detail.  2) BP/HTN:  -Her blood pressure is not controlled to target.  She had not long taken her medications prior to her appointment.  She is advised to continue Norvasc 2.5 mg po daily, Lasix 20 mg po daily, Metoprolol 50 mg po twice daily, and Valsartan-HCT 320-25 mg po daily.  3) Lipids/HPL:  Her most recent lipid panel from 02/25/19 shows uncontrolled LDL at 168.  She is not currently taking any cholesterol lowering medications at this time.  Will order lipids to be checked prior to next visit.  4)  Weight/Diet:  Her Body mass index is 47.13 kg/m.- clearly complicating her diabetes care.  She is a candidate for modest weight loss.  I discussed with her the fact that loss of 5 - 10% of her  current body weight will have the most impact on her diabetes management.  CDE Consult will be initiated . Exercise, and detailed carbohydrates information provided  -  detailed on discharge instructions.  5) Hypothyroidism: Her previsit TFTs are consistent with appropriate hormone replacement.  She is advised to continue Levothyroxine 88 mcg po daily before breakfast.    - We discussed about the correct intake of her thyroid hormone, on empty stomach at fasting, with water, separated by at least 30 minutes from breakfast and other medications,  and separated by more than 4 hours from calcium, iron, multivitamins, acid reflux medications  (PPIs). -Patient is made aware of the fact that  thyroid hormone replacement is needed for life, dose to be adjusted by periodic monitoring of thyroid function tests.  6) Chronic Care/Health Maintenance: -she  is on ACEI/ARB and is encouraged to initiate and continue to follow up with Ophthalmology, Dentist,  Podiatrist at least yearly or according to recommendations, and advised to  stay away from smoking. I have recommended yearly flu vaccine and pneumonia vaccine at least every 5 years; moderate intensity exercise for up to 150 minutes weekly; and  sleep for at least 7 hours a day.  - I advised patient to maintain close follow up with [name], PA-C for primary care needs.   - Time spent on this patient care encounter:  35 min, of which > 50% was spent in  counseling and the rest reviewing her blood glucose logs , discussing her hypoglycemia and hyperglycemia episodes, reviewing her current and  previous labs / studies  ( including abstraction from other facilities) and medications  doses and developing a  long term treatment plan and documenting her care.   Please refer to Patient Instructions for Blood Glucose Monitoring and Insulin/Medications Dosing Guide"  in media tab for additional information. Please  also refer to " Patient Self Inventory" in the Media  tab for reviewed elements of pertinent patient history.  Natasha Hinton in the discussions, expressed understanding, and voiced agreement with the above plans.  All questions were answered to her satisfaction. she is encouraged to contact clinic should she have any questions or concerns prior to her return visit.   Follow up plan: - Return in about 4 months (around 12/17/2020) for Diabetes follow up with A1c in office, Previsit labs, Bring glucometer and logs.  Rayetta Pigg, [facility] [address] [place], [place] [address] Phone: [phone] Fax:  [phone]  08/19/2020, 11:40 AM

## 2020-08-19 NOTE — Patient Instructions (Signed)

## 2020-11-13 ENCOUNTER — Other Ambulatory Visit: Payer: Self-pay | Admitting: "Endocrinology

## 2020-11-25 ENCOUNTER — Other Ambulatory Visit: Payer: Self-pay | Admitting: "Endocrinology

## 2020-12-13 LAB — TSH: TSH: 2.34 (ref 0.41–5.90)

## 2020-12-13 LAB — BASIC METABOLIC PANEL WITH GFR: Glucose: 225

## 2020-12-13 LAB — LIPID PANEL
LDL Cholesterol: 118
Triglycerides: 95 (ref 40–160)

## 2020-12-13 LAB — CBC AND DIFFERENTIAL
HCT: 34 — AB (ref 36–46)
Hemoglobin: 10.8 — AB (ref 12.0–16.0)

## 2020-12-13 LAB — COMPREHENSIVE METABOLIC PANEL WITH GFR: GFR calc Af Amer: 66

## 2020-12-17 ENCOUNTER — Other Ambulatory Visit: Payer: Self-pay

## 2020-12-17 ENCOUNTER — Encounter: Payer: Self-pay | Admitting: Nurse Practitioner

## 2020-12-17 ENCOUNTER — Ambulatory Visit (INDEPENDENT_AMBULATORY_CARE_PROVIDER_SITE_OTHER): Payer: Medicare Other | Admitting: Nurse Practitioner

## 2020-12-17 VITALS — BP 104/71 | HR 103 | Ht 66.0 in | Wt 279.0 lb

## 2020-12-17 DIAGNOSIS — E1165 Type 2 diabetes mellitus with hyperglycemia: Secondary | ICD-10-CM

## 2020-12-17 DIAGNOSIS — I10 Essential (primary) hypertension: Secondary | ICD-10-CM | POA: Diagnosis not present

## 2020-12-17 DIAGNOSIS — E039 Hypothyroidism, unspecified: Secondary | ICD-10-CM

## 2020-12-17 DIAGNOSIS — E782 Mixed hyperlipidemia: Secondary | ICD-10-CM | POA: Diagnosis not present

## 2020-12-17 DIAGNOSIS — E559 Vitamin D deficiency, unspecified: Secondary | ICD-10-CM

## 2020-12-17 LAB — POCT GLYCOSYLATED HEMOGLOBIN (HGB A1C): HbA1c, POC (controlled diabetic range): 8.9 % — AB (ref 0.0–7.0)

## 2020-12-17 NOTE — Patient Instructions (Signed)

## 2020-12-17 NOTE — Progress Notes (Signed)
12/17/2020     Endocrinology follow-up note     Subjective:    Patient ID: Natasha Hinton, female    DOB: 05/13/1951.  Natasha Hinton is being seen in follow up for management of currently uncontrolled symptomatic type 2 diabetes, hypothyroidism, hyperlipidemia, hypertension. PCP:   [name], PA-C.   Past Medical History:  Diagnosis Date  . Diabetes mellitus, type II ([place])   . Hyperlipidemia   . Hypertension    History reviewed. No pertinent surgical history. Social History   Socioeconomic History  . Marital status: Married    Spouse name: Not on file  . Number of children: Not on file  . Years of education: Not on file  . Highest education level: Not on file  Occupational History  . Not on file  Tobacco Use  . Smoking status: Never Smoker  . Smokeless tobacco: Never Used  Vaping Use  . Vaping Use: Never used  Substance and Sexual Activity  . Alcohol use: Not Currently  . Drug use: Never  . Sexual activity: Not on file  Other Topics Concern  . Not on file  Social History Narrative  . Not on file   Social Determinants of Health   Financial Resource Strain: Not on file  Food Insecurity: Not on file  Transportation Needs: Not on file  Physical Activity: Not on file  Stress: Not on file  Social Connections: Not on file   Outpatient Encounter Medications as of 12/17/2020  Medication Sig  . ACCU-CHEK GUIDE test strip USE TO TEST BLOOD SUGAR TWICE DAILY  . amLODipine (NORVASC) 2.5 MG tablet Take 2.5 mg by mouth daily.  . B-D ULTRAFINE III SHORT PEN 31G X 8 MM MISC USE AS DIRECTED AT BEDTIME  . blood glucose meter kit and supplies KIT Dispense based on patient and insurance preference. Use up to four times daily as directed. (FOR ICD-10 E11.65)  . Blood Glucose Monitoring Suppl (ACCU-CHEK GUIDE) w/Device KIT 1 Piece by Does not apply route as directed.  . bumetanide (BUMEX)  2 MG tablet bumetanide 2 mg tablet  TAKE 1 TABLET BY MOUTH EVERY DAY  . cetirizine (ZYRTEC) 10 MG tablet daily.  . clotrimazole-betamethasone (LOTRISONE) cream daily.  [name] EPINEPHrine 0.3 mg/0.3 mL IJ SOAJ injection epinephrine 0.3 mg/0.3 mL injection, auto-injector  . famotidine (PEPCID) 20 MG tablet famotidine 20 mg tablet  TAKE 1 TABLET BY MOUTH AT BEDTIME  . Ferrous Sulfate (IRON) 325 (65 Fe) MG TABS daily.  [name] LANTUS SOLOSTAR 100 UNIT/ML Solostar Pen ADMINISTER 40 UNITS UNDER THE SKIN AT BEDTIME  . levothyroxine (SYNTHROID) 88 MCG tablet TAKE 1 TABLET BY MOUTH DAILY BEFORE BREAKFAST  . metFORMIN (GLUCOPHAGE-XR) 500 MG 24 hr tablet Take 500 mg by mouth 2 (two) times daily with a meal.  . metoprolol tartrate (LOPRESSOR) 50 MG tablet Take 50 mg by mouth 2 (two) times daily.   . potassium chloride (KLOR-CON) 10 MEQ tablet TAKE 1 TABLET BY MOUTH EVERY DAY AS NEEDED WHEN TAKING LASIX  . tolterodine (DETROL) 2 MG tablet tolterodine 2 mg tablet  Take 2 tablets twice a day by oral route.  . valsartan-hydrochlorothiazide (DIOVAN-HCT) 320-25 MG tablet  TK 1 T PO QD  . [DISCONTINUED] furosemide (LASIX) 20 MG tablet daily.   No facility-administered encounter medications on file as of 12/17/2020.    ALLERGIES: Allergies  Allergen Reactions  . Lortab [Hydrocodone-Acetaminophen] Itching  . 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  Diabetes She presents for her follow-up diabetic visit. She has type 2 diabetes mellitus. Onset time: she was diagnosed at approximate age of 74 years. Her disease course has been stable. There are no hypoglycemic associated symptoms. Pertinent negatives for hypoglycemia include no confusion, headaches, nervousness/anxiousness, pallor, seizures or tremors. Associated symptoms include foot paresthesias. Pertinent negatives for diabetes include no chest pain, no fatigue, no polydipsia, no polyphagia and no  polyuria. There are no hypoglycemic complications. Symptoms are stable. Diabetic complications include heart disease, nephropathy and peripheral neuropathy. Risk factors for coronary artery disease include dyslipidemia, diabetes mellitus, obesity, hypertension, sedentary lifestyle and post-menopausal. Current diabetic treatment includes insulin injections and oral agent (monotherapy). She is compliant with treatment most of the time. Her weight is decreasing steadily. She is following a generally healthy diet. When asked about meal planning, she reported none. She has not had a previous visit with a dietitian. She rarely participates in exercise. Her home blood glucose trend is fluctuating minimally. Her breakfast blood glucose range is generally 110-130 mg/dl. (She presents today with her meter and logs showing stable fasting glycemic profile.  Her POCT A1c today is 8.9%, increasing from last visit of 8.3%.  This number may be skewed due to her anemia of chronic disease.  Analysis of her meter shows 7-day average of 112, 14-day average of 136, 30-day average of 118, and 90-day average of 121.  She did receive steroid shot in her back in January which caused her sugars to go up in which her PCP recommended she restart her Glipizide for a short period of time.  She is not currently taking the Glipizide.  There are no significant episodes of hypoglycemia documented or reported.) An ACE inhibitor/angiotensin II receptor blocker is being taken. She does not see a podiatrist.Eye exam is current.  Hyperlipidemia This is a chronic problem. The current episode started more than 1 year ago. The problem is uncontrolled (improving). Recent lipid tests were reviewed and are high. Exacerbating diseases include chronic renal disease, diabetes, hypothyroidism and obesity. Factors aggravating her hyperlipidemia include thiazides, fatty foods and beta blockers. Pertinent negatives include no chest pain, myalgias or shortness of  breath. She is currently on no antihyperlipidemic treatment. Compliance problems include adherence to diet and adherence to exercise.  Risk factors for coronary artery disease include diabetes mellitus, dyslipidemia, hypertension, a sedentary lifestyle, post-menopausal, obesity and family history.  Hypertension This is a chronic problem. The current episode started more than 1 year ago. The problem has been gradually improving since onset. The problem is controlled. Pertinent negatives include no chest pain, headaches, palpitations or shortness of breath. Agents associated with hypertension include thyroid hormones. Risk factors for coronary artery disease include dyslipidemia, diabetes mellitus, obesity, sedentary lifestyle, family history and post-menopausal state. Past treatments include angiotensin blockers, calcium channel blockers and diuretics. The current treatment provides mild improvement. There are no compliance problems.  Hypertensive end-organ damage includes kidney disease. Identifiable causes of hypertension include chronic renal disease and a thyroid problem.  Thyroid Problem Presents for follow-up visit. Symptoms include leg swelling. Patient reports no anxiety, cold intolerance, constipation, depressed mood, fatigue, heat intolerance, palpitations, tremors or weight  gain. Her past medical history is significant for diabetes and hyperlipidemia.    Review of systems  Constitutional: + Minimally fluctuating body weight,  current Body mass index is 45.03 kg/m. , no fatigue, no subjective hyperthermia, no subjective hypothermia Eyes: no blurry vision, no xerophthalmia ENT: no sore throat, no nodules palpated in throat, no dysphagia/odynophagia, no hoarseness Cardiovascular: no chest pain, no shortness of breath, no palpitations, + intermittent leg swelling Respiratory: no cough, no shortness of breath Gastrointestinal: no nausea/vomiting/diarrhea Musculoskeletal: complains of leg  aches Skin: no rashes, no hyperemia Neurological: no tremors, +numbness/tingling to BLE, no dizziness Psychiatric: no depression, no anxiety  Objective:    BP 104/71 (BP Location: Right Arm, Patient Position: Sitting)   Pulse (!) 103   Ht _0  (1.676 m)   Wt 279 lb (126.6 kg)   BMI 45.03 kg/m   Wt Readings from Last 3 Encounters:  12/17/20 279 lb (126.6 kg)  08/19/20 292 lb (132.5 kg)  04/15/20 293 lb 12.8 oz (133.3 kg)    BP Readings from Last 3 Encounters:  12/17/20 104/71  08/19/20 (!) 150/81  04/15/20 136/76    Physical Exam- Limited  Constitutional:  Body mass index is 45.03 kg/m. , not in acute distress, normal state of mind Eyes:  EOMI, no exophthalmos Neck: Supple Cardiovascular: RRR, no murmers, rubs, or gallops, no edema Respiratory: Adequate breathing efforts, no crackles, rales, rhonchi, or wheezing Musculoskeletal: no gross deformities, strength intact in all four extremities, no gross restriction of joint movements Skin:  no rashes, no hyperemia Neurological: no tremor with outstretched hands     Recent Results (from the past 2160 hour(s))  CBC and differential     Status: Abnormal   Collection Time: 12/13/20 12:00 AM  Result Value Ref Range   Hemoglobin 10.8 (A) 12.0 - 16.0   HCT 34 (A) 36 - 46  Basic metabolic panel     Status: None   Collection Time: 12/13/20 12:00 AM  Result Value Ref Range   Glucose 225   Comprehensive metabolic panel     Status: None   Collection Time: 12/13/20 12:00 AM  Result Value Ref Range   GFR calc Af Amer 66   Lipid panel     Status: None   Collection Time: 12/13/20 12:00 AM  Result Value Ref Range   Triglycerides 95 40 - 160   LDL Cholesterol 118   TSH     Status: None   Collection Time: 12/13/20 12:00 AM  Result Value Ref Range   TSH 2.34 0.41 - 5.90    Comment: FT4- 1.27  HgB A1c     Status: Abnormal   Collection Time: 12/17/20 10:33 AM  Result Value Ref Range   Hemoglobin A1C     HbA1c POC (<> result,  manual entry)     HbA1c, POC (prediabetic range)     HbA1c, POC (controlled diabetic range) 8.9 (A) 0.0 - 7.0 %   Lipid Panel     Component Value Date/Time   CHOL 266 (A) 02/25/2019 0000   TRIG 95 12/13/2020 0000   HDL 77 (A) 02/25/2019 0000   LDLCALC 118 12/13/2020 0000   CMP Latest Ref Rng & Units 08/13/2020 01/01/2020 05/29/2019  BUN 4 - _1 Creatinine 0.5 - 1.1 0.8 0.9 0.8  Calcium 8.7 - 10.7 8.7 - -    Assessment & Plan:   1) Uncontrolled type 2 diabetes mellitus with hyperglycemia (HCC)  - [place] has currently uncontrolled symptomatic  type 2 DM since 70 years of age.  She presents today with her meter and logs showing stable fasting glycemic profile.  Her POCT A1c today is 8.9%, increasing from last visit of 8.3%.  This number may be skewed due to her anemia of chronic disease.  Analysis of her meter shows 7-day average of 112, 14-day average of 136, 30-day average of 118, and 90-day average of 121.  She did receive steroid shot in her back in January which caused her sugars to go up in which her PCP recommended she restart her Glipizide for a short period of time.  She is not currently taking the Glipizide.  There are no significant episodes of hypoglycemia documented or reported.    -her diabetes is complicated by obesity/sedentary life and she remains at a high risk for more acute and chronic complications which include CAD, CVA, CKD, retinopathy, and neuropathy. These are all discussed in detail with her.  - Nutritional counseling repeated at each appointment due to patients tendency to fall back in to old habits.  - The patient admits there is a room for improvement in their diet and drink choices. -  Suggestion is made for the patient to avoid simple carbohydrates from their diet including Cakes, Sweet Desserts / Pastries, Ice Cream, Soda (diet and regular), Sweet Tea, Candies, Chips, Cookies, Sweet Pastries,  Store Bought Juices, Alcohol in Excess of  1-2  drinks a day, Artificial Sweeteners, Coffee Creamer, and "Sugar-free" Products. This will help patient to have stable blood glucose profile and potentially avoid unintended weight gain.   - I encouraged the patient to switch to  unprocessed or minimally processed complex starch and increased protein intake (animal or plant source), fruits, and vegetables.   - Patient is advised to stick to a routine mealtimes to eat 3 meals a day and avoid unnecessary snacks ( to snack only to correct hypoglycemia).  - I have approached her with the following individualized plan to manage diabetes and patient agrees:   -Based on her controlled fasting glycemic profile, she will not tolerate any higher dose of basal insulin, advised to continue Lantus 40 units nightly.  She is advised to continue Metformin 500 mg po twice daily with meals.      -She is encouraged to continue monitoring blood glucose twice daily, before breakfast and before bed, and call the clinic if she has readings less than 70 or greater than 300 for 3 tests in a row or fasting for 3 days out of the week.  - Patient specific target  A1c;  LDL, HDL, Triglycerides,  were discussed in detail.  2) BP/HTN:  -Her blood pressure is controlled to target.  She is advised to continue Norvasc 2.5 mg po daily, Bumex 2 mg daily, Metoprolol 50 mg po twice daily, and Valsartan-HCT 320-25 mg po daily.  3) Lipids/HPL:  Her most recent lipid panel from 12/13/20 shows uncontrolled yet improved LDL at 118.  She is not currently taking any cholesterol lowering medications at this time.    4)  Weight/Diet:  Her Body mass index is 45.03 kg/m.- clearly complicating her diabetes care.  She is a candidate for modest weight loss.  I discussed with her the fact that loss of 5 - 10% of her  current body weight will have the most impact on her diabetes management.  CDE Consult will be initiated . Exercise, and detailed carbohydrates information provided  -  detailed on  discharge instructions.  5) Hypothyroidism: Her previsit  TFTs are consistent with appropriate hormone replacement.  She is advised to continue Levothyroxine 88 mcg po daily before breakfast.    - We discussed about the correct intake of her thyroid hormone, on empty stomach at fasting, with water, separated by at least 30 minutes from breakfast and other medications,  and separated by more than 4 hours from calcium, iron, multivitamins, acid reflux medications (PPIs). -Patient is made aware of the fact that thyroid hormone replacement is needed for life, dose to be adjusted by periodic monitoring of thyroid function tests.  6) Chronic Care/Health Maintenance: -she is on ACEI/ARB and is encouraged to initiate and continue to follow up with Ophthalmology, Dentist, Podiatrist at least yearly or according to recommendations, and advised to stay away from smoking. I have recommended yearly flu vaccine and pneumonia vaccine at least every 5 years; moderate intensity exercise for up to 150 minutes weekly; and sleep for at least 7 hours a day.  - I advised patient to maintain close follow up with [name], PA-C for primary care needs.   - Time spent on this patient care encounter:  40 min, of which > 50% was spent in  counseling and the rest reviewing her blood glucose logs , discussing her hypoglycemia and hyperglycemia episodes, reviewing her current and  previous labs / studies  ( including abstraction from other facilities) and medications  doses and developing a  long term treatment plan and documenting her care.   Please refer to Patient Instructions for Blood Glucose Monitoring and Insulin/Medications Dosing Guide"  in media tab for additional information. Please  also refer to " Patient Self Inventory" in the Media  tab for reviewed elements of pertinent patient history.  [place] participated in the discussions, expressed understanding, and voiced agreement with the above plans.   All questions were answered to her satisfaction. she is encouraged to contact clinic should she have any questions or concerns prior to her return visit.   Follow up plan: - Return in about 3 months (around 03/19/2021) for Diabetes follow up with A1c in office, No previsit labs, Bring glucometer and logs.  Rayetta Pigg, [facility] [address] [place], [place] [address] Phone: [phone] Fax: [phone]  12/17/2020, 10:57 AM

## 2021-02-06 ENCOUNTER — Other Ambulatory Visit: Payer: Self-pay | Admitting: "Endocrinology

## 2021-03-19 ENCOUNTER — Other Ambulatory Visit: Payer: Self-pay | Admitting: "Endocrinology

## 2021-03-19 ENCOUNTER — Telehealth: Payer: Self-pay | Admitting: Nurse Practitioner

## 2021-03-19 MED ORDER — LANTUS SOLOSTAR 100 UNIT/ML ~~LOC~~ SOPN: PEN_INJECTOR | SUBCUTANEOUS | 0 refills | Status: AC

## 2021-03-19 NOTE — Telephone Encounter (Signed)
Pt called and states she is in [place] at the [facility] and she left her insulin at home, she will be there through the weekend and is needing one supply of  LANTUS SOLOSTAR 100 UNIT/ML Solostar Pen    [id number] [place], [place] - [phone] Phone:  [phone]  Fax:  [phone]

## 2021-03-20 ENCOUNTER — Ambulatory Visit: Payer: Medicare Other | Admitting: Nurse Practitioner

## 2021-03-25 ENCOUNTER — Ambulatory Visit (INDEPENDENT_AMBULATORY_CARE_PROVIDER_SITE_OTHER): Payer: Medicare Other | Admitting: Nurse Practitioner

## 2021-03-25 ENCOUNTER — Encounter: Payer: Self-pay | Admitting: Nurse Practitioner

## 2021-03-25 ENCOUNTER — Other Ambulatory Visit: Payer: Self-pay

## 2021-03-25 VITALS — BP 121/70 | HR 87 | Ht 66.0 in | Wt 271.0 lb

## 2021-03-25 DIAGNOSIS — E039 Hypothyroidism, unspecified: Secondary | ICD-10-CM

## 2021-03-25 DIAGNOSIS — E782 Mixed hyperlipidemia: Secondary | ICD-10-CM | POA: Diagnosis not present

## 2021-03-25 DIAGNOSIS — I10 Essential (primary) hypertension: Secondary | ICD-10-CM | POA: Diagnosis not present

## 2021-03-25 DIAGNOSIS — E1165 Type 2 diabetes mellitus with hyperglycemia: Secondary | ICD-10-CM

## 2021-03-25 DIAGNOSIS — E559 Vitamin D deficiency, unspecified: Secondary | ICD-10-CM

## 2021-03-25 LAB — POCT GLYCOSYLATED HEMOGLOBIN (HGB A1C): Hemoglobin A1C: 7.7 % — AB (ref 4.0–5.6)

## 2021-03-25 LAB — POCT UA - MICROALBUMIN
Albumin/Creatinine Ratio, Urine, POC: <30
Creatinine, POC: 200 mg/dL
Microalbumin Ur, POC: 10 mg/L

## 2021-03-25 NOTE — Patient Instructions (Signed)

## 2021-03-25 NOTE — Progress Notes (Signed)
Pt had ABI done at [facility] in [place].

## 2021-03-25 NOTE — Addendum Note (Signed)
Addended by: Jannifer Franklin A on: 03/25/2021 01:41 PM   Modules accepted: Orders

## 2021-03-25 NOTE — Progress Notes (Signed)
03/25/2021     Endocrinology follow-up note     Subjective:    Patient ID: Natasha Hinton, female    DOB: 06-27-1951.  Natasha Hinton is being seen in follow up for management of currently uncontrolled symptomatic type 2 diabetes, hypothyroidism, hyperlipidemia, hypertension. PCP:   [name], PA-C.   Past Medical History:  Diagnosis Date   Diabetes mellitus, type II ([place])    Hyperlipidemia    Hypertension    History reviewed. No pertinent surgical history. Social History   Socioeconomic History   Marital status: Married    Spouse name: Not on file   Number of children: Not on file   Years of education: Not on file   Highest education level: Not on file  Occupational History   Not on file  Tobacco Use   Smoking status: Never   Smokeless tobacco: Never  Vaping Use   Vaping Use: Never used  Substance and Sexual Activity   Alcohol use: Not Currently   Drug use: Never   Sexual activity: Not on file  Other Topics Concern   Not on file  Social History Narrative   Not on file   Social Determinants of Health   Financial Resource Strain: Not on file  Food Insecurity: Not on file  Transportation Needs: Not on file  Physical Activity: Not on file  Stress: Not on file  Social Connections: Not on file   Outpatient Encounter Medications as of 03/25/2021  Medication Sig   oxybutynin (DITROPAN XL) 15 MG 24 hr tablet Take 15 mg by mouth at bedtime.   ACCU-CHEK GUIDE test strip USE TO TEST BLOOD SUGAR TWICE DAILY   amLODipine (NORVASC) 2.5 MG tablet Take 2.5 mg by mouth daily.   B-D ULTRAFINE III SHORT PEN 31G X 8 MM MISC USE AS DIRECTED AT BEDTIME   blood glucose meter kit and supplies KIT Dispense based on patient and insurance preference. Use up to four times daily as directed. (FOR ICD-10 E11.65)   Blood Glucose Monitoring Suppl (ACCU-CHEK GUIDE) w/Device KIT 1 Piece by Does not  apply route as directed.   bumetanide (BUMEX) 2 MG tablet bumetanide 2 mg tablet  TAKE 1 TABLET BY MOUTH EVERY DAY   cetirizine (ZYRTEC) 10 MG tablet daily.   clotrimazole-betamethasone (LOTRISONE) cream daily.   EPINEPHrine 0.3 mg/0.3 mL IJ SOAJ injection epinephrine 0.3 mg/0.3 mL injection, auto-injector   famotidine (PEPCID) 20 MG tablet famotidine 20 mg tablet  TAKE 1 TABLET BY MOUTH AT BEDTIME   Ferrous Sulfate (IRON) 325 (65 Fe) MG TABS daily.   insulin glargine (LANTUS SOLOSTAR) 100 UNIT/ML Solostar Pen ADMINISTER 40 UNITS UNDER THE SKIN AT BEDTIME   levothyroxine (SYNTHROID) 88 MCG tablet TAKE 1 TABLET BY MOUTH DAILY BEFORE AND BREAKFAST   metFORMIN (GLUCOPHAGE-XR) 500 MG 24 hr tablet Take 500 mg by mouth 2 (two) times daily with a meal.   metoprolol tartrate (LOPRESSOR) 50 MG tablet Take 50 mg by mouth 2 (two) times daily.    potassium chloride (KLOR-CON) 10 MEQ tablet TAKE 1 TABLET BY MOUTH EVERY DAY AS NEEDED WHEN TAKING LASIX   valsartan-hydrochlorothiazide (DIOVAN-HCT) 320-25 MG tablet TK 1 T  PO QD   [DISCONTINUED] tolterodine (DETROL) 2 MG tablet tolterodine 2 mg tablet  Take 2 tablets twice a day by oral route.   No facility-administered encounter medications on file as of 03/25/2021.    ALLERGIES: Allergies  Allergen Reactions   Lortab [Hydrocodone-Acetaminophen] Itching   Meat [Alpha-Gal]     VACCINATION STATUS:  There is no immunization history on file for this patient.  Diabetes She presents for her follow-up diabetic visit. She has type 2 diabetes mellitus. Onset time: she was diagnosed at approximate age of 56 years. Her disease course has been improving. Hypoglycemia symptoms include nervousness/anxiousness, sweats and tremors. Pertinent negatives for hypoglycemia include no confusion, headaches, pallor or seizures. Associated symptoms include foot paresthesias. Pertinent negatives for diabetes include no chest pain, no fatigue, no polydipsia, no polyphagia and  no polyuria. Hypoglycemia complications include nocturnal hypoglycemia. Symptoms are stable. Diabetic complications include heart disease, nephropathy and peripheral neuropathy. Risk factors for coronary artery disease include dyslipidemia, diabetes mellitus, obesity, hypertension, sedentary lifestyle and post-menopausal. Current diabetic treatment includes insulin injections and oral agent (monotherapy). She is compliant with treatment most of the time. Her weight is decreasing steadily. She is following a generally healthy diet. When asked about meal planning, she reported none. She has not had a previous visit with a dietitian. She rarely participates in exercise. Her home blood glucose trend is decreasing steadily. Her breakfast blood glucose range is generally 70-90 mg/dl. (She presents today with her meter and logs, showing tight fasting glycemic profile with frequent fasting hypoglycemia.  Her POCT A1c today is 7.7%, improving from last visit of 8.9%.  Analysis of her meter shows 7-day average of 94, 14-day average of 82, 30-day average of 84, 90-day average of 131.  She reports she hasn't been taking her Metformin consistently due to fear of hypoglycemia.) An ACE inhibitor/angiotensin II receptor blocker is being taken. She does not see a podiatrist.Eye exam is current.  Hyperlipidemia This is a chronic problem. The current episode started more than 1 year ago. The problem is uncontrolled (improving). Recent lipid tests were reviewed and are high. Exacerbating diseases include chronic renal disease, diabetes, hypothyroidism and obesity. Factors aggravating her hyperlipidemia include thiazides, fatty foods and beta blockers. Pertinent negatives include no chest pain, myalgias or shortness of breath. She is currently on no antihyperlipidemic treatment. Compliance problems include adherence to diet and adherence to exercise.  Risk factors for coronary artery disease include diabetes mellitus, dyslipidemia,  hypertension, a sedentary lifestyle, post-menopausal, obesity and family history.  Hypertension This is a chronic problem. The current episode started more than 1 year ago. The problem has been gradually improving since onset. The problem is controlled. Associated symptoms include sweats. Pertinent negatives include no chest pain, headaches, palpitations or shortness of breath. Agents associated with hypertension include thyroid hormones. Risk factors for coronary artery disease include dyslipidemia, diabetes mellitus, obesity, sedentary lifestyle, family history and post-menopausal state. Past treatments include angiotensin blockers, calcium channel blockers and diuretics. The current treatment provides mild improvement. There are no compliance problems.  Hypertensive end-organ damage includes kidney disease. Identifiable causes of hypertension include chronic renal disease and a thyroid problem.  Thyroid Problem Presents for follow-up visit. Symptoms include anxiety, leg swelling and tremors. Patient reports no cold intolerance, constipation, depressed mood, fatigue, heat intolerance, palpitations or weight gain. Her past medical history is significant for diabetes and hyperlipidemia.   Review of systems  Constitutional: + Minimally fluctuating body weight,  current Body mass index is 43.74 kg/m. , +fatigue,  no subjective hyperthermia, no subjective hypothermia Eyes: no blurry vision, no xerophthalmia ENT: no sore throat, no nodules palpated in throat, no dysphagia/odynophagia, no hoarseness Cardiovascular: no chest pain, no shortness of breath, no palpitations, + intermittent leg swelling Respiratory: no cough, no shortness of breath Gastrointestinal: no nausea/vomiting/diarrhea Musculoskeletal: complains of leg aches Skin: no rashes, no hyperemia Neurological: no tremors, + numbness/tingling to BLE, no dizziness Psychiatric: no depression, no anxiety  Objective:    BP 121/70   Pulse 87    Ht '5\' 6"'  (1.676 m)   Wt 271 lb (122.9 kg)   BMI 43.74 kg/m   Wt Readings from Last 3 Encounters:  03/25/21 271 lb (122.9 kg)  12/17/20 279 lb (126.6 kg)  08/19/20 292 lb (132.5 kg)    BP Readings from Last 3 Encounters:  03/25/21 121/70  12/17/20 104/71  08/19/20 (!) 150/81     Physical Exam- Limited  Constitutional:  Body mass index is 43.74 kg/m. , not in acute distress, normal state of mind Eyes:  EOMI, no exophthalmos Neck: Supple Cardiovascular: RRR, no murmurs, rubs, or gallops, no edema Respiratory: Adequate breathing efforts, no crackles, rales, rhonchi, or wheezing Musculoskeletal: no gross deformities, strength intact in all four extremities, no gross restriction of joint movements Skin:  no rashes, no hyperemia Neurological: no tremor with outstretched hands     Recent Results (from the past 2160 hour(s))  HgB A1c     Status: Abnormal   Collection Time: 03/25/21 11:33 AM  Result Value Ref Range   Hemoglobin A1C 7.7 (A) 4.0 - 5.6 %   HbA1c POC (<> result, manual entry)     HbA1c, POC (prediabetic range)     HbA1c, POC (controlled diabetic range)     Lipid Panel     Component Value Date/Time   CHOL 266 (A) 02/25/2019 0000   TRIG 95 12/13/2020 0000   HDL 77 (A) 02/25/2019 0000   LDLCALC 118 12/13/2020 0000   CMP Latest Ref Rng & Units 08/13/2020 01/01/2020 05/29/2019  BUN 4 - '21 13 16 15  ' Creatinine 0.5 - 1.1 0.8 0.9 0.8  Calcium 8.7 - 10.7 8.7 - -    Assessment & Plan:   1) Uncontrolled type 2 diabetes mellitus with hyperglycemia ([facility])  - Natasha Hinton has currently uncontrolled symptomatic type 2 DM since 70 years of age.  She presents today with her meter and logs, showing tight fasting glycemic profile with frequent fasting hypoglycemia.  Her POCT A1c today is 7.7%, improving from last visit of 8.9%.  Analysis of her meter shows 7-day average of 94, 14-day average of 82, 30-day average of 84, 90-day average of 131.  She reports she hasn't been  taking her Metformin consistently due to fear of hypoglycemia.    -her diabetes is complicated by obesity/sedentary life and she remains at a high risk for more acute and chronic complications which include CAD, CVA, CKD, retinopathy, and neuropathy. These are all discussed in detail with her.  - Nutritional counseling repeated at each appointment due to patients tendency to fall back in to old habits.  - The patient admits there is a room for improvement in their diet and drink choices. -  Suggestion is made for the patient to avoid simple carbohydrates from their diet including Cakes, Sweet Desserts / Pastries, Ice Cream, Soda (diet and regular), Sweet Tea, Candies, Chips, Cookies, Sweet Pastries, Store Bought Juices, Alcohol in Excess of 1-2 drinks a day, Artificial Sweeteners, Coffee Creamer, and "Sugar-free" Products. This will  help patient to have stable blood glucose profile and potentially avoid unintended weight gain.   - I encouraged the patient to switch to unprocessed or minimally processed complex starch and increased protein intake (animal or plant source), fruits, and vegetables.   - Patient is advised to stick to a routine mealtimes to eat 3 meals a day and avoid unnecessary snacks (to snack only to correct hypoglycemia).  - I have approached her with the following individualized plan to manage diabetes and patient agrees:   -Based on her frequent fasting hypoglycemia, she is advised to lower her dose of Lantus to 30 units nightly.  She is advised to continue her Metformin 500 mg po twice daily with meals.      -She is encouraged to continue monitoring blood glucose twice daily, before breakfast and before bed, and call the clinic if she has readings less than 70 or greater than 300 for 3 tests in a row or fasting for 3 days out of the week.  - Patient specific target  A1c;  LDL, HDL, Triglycerides,  were discussed in detail.  2) BP/HTN:  -Her blood pressure is controlled to  target.  She is advised to continue Norvasc 2.5 mg po daily, Bumex 2 mg daily, Metoprolol 50 mg po twice daily, and Valsartan-HCT 320-25 mg po daily.  3) Lipids/HPL:  Her most recent lipid panel from 12/13/20 shows uncontrolled yet improved LDL at 118.  She is not currently taking any cholesterol lowering medications at this time.    4)  Weight/Diet:  Her Body mass index is 43.74 kg/m.- clearly complicating her diabetes care.  She is a candidate for modest weight loss.  I discussed with her the fact that loss of 5 - 10% of her  current body weight will have the most impact on her diabetes management.  CDE Consult will be initiated . Exercise, and detailed carbohydrates information provided  -  detailed on discharge instructions.  5) Hypothyroidism: She does not have recent TFTs to review.  She is advised to continue current dose of Levothyroxine at 88 mcg po daily before breakfast.  Will recheck thyroid function tests prior to next visit and adjust dose if needed.     - We discussed about the correct intake of her thyroid hormone, on empty stomach at fasting, with water, separated by at least 30 minutes from breakfast and other medications,  and separated by more than 4 hours from calcium, iron, multivitamins, acid reflux medications (PPIs). -Patient is made aware of the fact that thyroid hormone replacement is needed for life, dose to be adjusted by periodic monitoring of thyroid function tests.  6) Chronic Care/Health Maintenance: -she is on ACEI/ARB and is encouraged to initiate and continue to follow up with Ophthalmology, Dentist, Podiatrist at least yearly or according to recommendations, and advised to stay away from smoking. I have recommended yearly flu vaccine and pneumonia vaccine at least every 5 years; moderate intensity exercise for up to 150 minutes weekly; and sleep for at least 7 hours a day.  - I advised patient to maintain close follow up with [name], PA-C for primary  care needs.     I spent 30 minutes in the care of the patient today including review of labs from [place] [place], Lipids, Thyroid Function, Hematology (current and previous including abstractions from other facilities); face-to-face time discussing  her blood glucose readings/logs, discussing hypoglycemia and hyperglycemia episodes and symptoms, medications doses, her options of short and long term treatment based  on the latest standards of care / guidelines;  discussion about incorporating lifestyle medicine;  and documenting the encounter.    Please refer to Patient Instructions for Blood Glucose Monitoring and Insulin/Medications Dosing Guide"  in media tab for additional information. Please  also refer to " Patient Self Inventory" in the Media  tab for reviewed elements of pertinent patient history.  Natasha Hinton participated in the discussions, expressed understanding, and voiced agreement with the above plans.  All questions were answered to her satisfaction. she is encouraged to contact clinic should she have any questions or concerns prior to her return visit.   Follow up plan: - Return in about 4 months (around 07/25/2021) for Diabetes F/U with A1c in office, Previsit labs, Thyroid follow up, Bring meter and logs.    Rayetta Pigg, [facility] [address] [place], [place] [address] Phone: [phone] Fax: [phone]  03/25/2021, 11:40 AM

## 2021-05-01 ENCOUNTER — Other Ambulatory Visit: Payer: Self-pay | Admitting: "Endocrinology

## 2021-05-12 ENCOUNTER — Other Ambulatory Visit: Payer: Self-pay | Admitting: Nurse Practitioner

## 2021-07-25 ENCOUNTER — Ambulatory Visit: Payer: Medicare Other | Admitting: Nurse Practitioner

## 2021-09-17 ENCOUNTER — Other Ambulatory Visit: Payer: Self-pay | Admitting: Nurse Practitioner

## 2021-10-20 ENCOUNTER — Other Ambulatory Visit: Payer: Self-pay | Admitting: Nurse Practitioner
